# Patient Record
Sex: Male | Born: 1955 | Race: Asian | Hispanic: No | Marital: Married | Smoking: Former smoker
Health system: Southern US, Community
[De-identification: ages and names within clinical notes are randomized; demographics above are authoritative.]

## PROBLEM LIST (undated history)

## (undated) DIAGNOSIS — E785 Hyperlipidemia, unspecified: Secondary | ICD-10-CM

## (undated) DIAGNOSIS — E119 Type 2 diabetes mellitus without complications: Secondary | ICD-10-CM

## (undated) DIAGNOSIS — M1990 Unspecified osteoarthritis, unspecified site: Secondary | ICD-10-CM

## (undated) DIAGNOSIS — I10 Essential (primary) hypertension: Secondary | ICD-10-CM

## (undated) HISTORY — DX: Hyperlipidemia, unspecified: E78.5

---

## 2016-06-26 ENCOUNTER — Ambulatory Visit: Payer: Self-pay | Admitting: Family Medicine

## 2016-09-07 ENCOUNTER — Emergency Department
Admission: EM | Admit: 2016-09-07 | Discharge: 2016-09-08 | Disposition: A | Discharge status: Home / Self Care | Payer: Medicaid Other | Attending: Emergency Medicine | Admitting: Emergency Medicine

## 2016-09-07 ENCOUNTER — Emergency Department: Payer: Medicaid Other

## 2016-09-07 ENCOUNTER — Encounter: Payer: Self-pay | Admitting: Emergency Medicine

## 2016-09-07 DIAGNOSIS — Y929 Unspecified place or not applicable: Secondary | ICD-10-CM | POA: Diagnosis not present

## 2016-09-07 DIAGNOSIS — Y990 Civilian activity done for income or pay: Secondary | ICD-10-CM | POA: Insufficient documentation

## 2016-09-07 DIAGNOSIS — E119 Type 2 diabetes mellitus without complications: Secondary | ICD-10-CM | POA: Diagnosis not present

## 2016-09-07 DIAGNOSIS — Y9389 Activity, other specified: Secondary | ICD-10-CM | POA: Diagnosis not present

## 2016-09-07 DIAGNOSIS — S060X0A Concussion without loss of consciousness, initial encounter: Secondary | ICD-10-CM

## 2016-09-07 DIAGNOSIS — I10 Essential (primary) hypertension: Secondary | ICD-10-CM | POA: Diagnosis not present

## 2016-09-07 DIAGNOSIS — W2209XA Striking against other stationary object, initial encounter: Secondary | ICD-10-CM | POA: Insufficient documentation

## 2016-09-07 DIAGNOSIS — S0990XA Unspecified injury of head, initial encounter: Secondary | ICD-10-CM | POA: Diagnosis present

## 2016-09-07 HISTORY — DX: Unspecified osteoarthritis, unspecified site: M19.90

## 2016-09-07 HISTORY — DX: Type 2 diabetes mellitus without complications: E11.9

## 2016-09-07 HISTORY — DX: Essential (primary) hypertension: I10

## 2016-09-07 LAB — COMPREHENSIVE METABOLIC PANEL
ALK PHOS: 78 U/L (ref 38–126)
ALT: 18 U/L (ref 17–63)
ANION GAP: 6 (ref 5–15)
AST: 24 U/L (ref 15–41)
Albumin: 4.1 g/dL (ref 3.5–5.0)
BILIRUBIN TOTAL: 0.4 mg/dL (ref 0.3–1.2)
BUN: 17 mg/dL (ref 6–20)
CALCIUM: 8.8 mg/dL — AB (ref 8.9–10.3)
CO2: 24 mmol/L (ref 22–32)
Chloride: 105 mmol/L (ref 101–111)
Creatinine, Ser: 1.1 mg/dL (ref 0.61–1.24)
GFR calc non Af Amer: >60 mL/min (ref >60)
GLUCOSE: 207 mg/dL — AB (ref 65–99)
Potassium: 3.9 mmol/L (ref 3.5–5.1)
Sodium: 135 mmol/L (ref 135–145)
TOTAL PROTEIN: 7.5 g/dL (ref 6.5–8.1)

## 2016-09-07 LAB — CBC WITH DIFFERENTIAL/PLATELET
Basophils Absolute: 0.1 10*3/uL (ref 0–0.1)
Basophils Relative: 1 %
Eosinophils Absolute: 0.2 10*3/uL (ref 0–0.7)
Eosinophils Relative: 2 %
HEMATOCRIT: 38.7 % — AB (ref 40.0–52.0)
Hemoglobin: 12.8 g/dL — ABNORMAL LOW (ref 13.0–18.0)
LYMPHS ABS: 2.7 10*3/uL (ref 1.0–3.6)
Lymphocytes Relative: 28 %
MCH: 25.3 pg — AB (ref 26.0–34.0)
MCHC: 33.2 g/dL (ref 32.0–36.0)
MCV: 76.3 fL — AB (ref 80.0–100.0)
MONOS PCT: 9 %
Monocytes Absolute: 0.8 10*3/uL (ref 0.2–1.0)
NEUTROS ABS: 5.7 10*3/uL (ref 1.4–6.5)
NEUTROS PCT: 60 %
Platelets: 212 10*3/uL (ref 150–440)
RBC: 5.07 MIL/uL (ref 4.40–5.90)
RDW: 13.4 % (ref 11.5–14.5)
WBC: 9.5 10*3/uL (ref 3.8–10.6)

## 2016-09-07 LAB — SALICYLATE LEVEL: Salicylate Lvl: <7 mg/dL (ref 2.8–30.0)

## 2016-09-07 NOTE — ED Triage Notes (Signed)
States he hears a hum in his left ear that is constant for 3 days and also his left eye he has periodic blurry vision in his left eye that lasts any where from 5 to 15 mins then resolves.

## 2016-09-07 NOTE — ED Notes (Signed)
Pt reports 2 days of left earache, pressure behind left eye and blurred; vision; ambulatory with steady gait; declined offer of wheelchair

## 2016-09-07 NOTE — ED Provider Notes (Signed)
[facility] Emergency Department Provider Note  ____________________________________________   First MD Initiated Contact with Patient 09/07/16 2306     (approximate)  I have reviewed the triage vital signs and the nursing notes.   HISTORY  Chief Complaint Tinnitus and Blurred Vision   HPI Samuel Brady is a 60 y.o. male with a history of diabetes and hypertension presenting with 3 days of left-sided headache as well as intermittently blurred vision to his left eye and ringing in his left ear. He says that this all started after he hit his head against the door accidentally work. He says that he was dazed but did not lose consciousness. Says that he has a mild-to-moderate headache as well to left side of his head. Takes a daily aspirin but no other blood thinners. He says that he spoke to his primary care doctor about his current symptoms who suggested he come to the emergency department if the symptoms are persistent. He says that the blurred vision to the left eye is intermittent and that right now he has normal vision. He says that the ringing in his ear is new since this trauma as well. 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   Past Medical History:  Diagnosis Date  . Arthritis   . Diabetes mellitus without complication (HCC)   . Hypertension     There are no active problems to display for this patient.   History reviewed. No pertinent surgical history.  Prior to Admission medications   Not on File    Allergies Patient has no known allergies.  History reviewed. No pertinent family history.  Social History Social History  Substance Use Topics  . Smoking status: Never Smoker  . Smokeless tobacco: Never Used  . Alcohol use No    Review of Systems Constitutional: No fever/chills Eyes:  visual changes. ENT: No sore throat. Cardiovascular: Denies chest pain. Respiratory: Denies shortness of  breath. Gastrointestinal: No abdominal pain.  No nausea, no vomiting.  No diarrhea.  No constipation. Genitourinary: Negative for dysuria. Musculoskeletal: Negative for back pain. Skin: Negative for rash. Neurological: Negative for focal weakness or numbness.  10-point ROS otherwise negative.  ____________________________________________   PHYSICAL EXAM:  VITAL SIGNS: ED Triage Vitals  Enc Vitals Group     BP 09/07/16 2008 (!) 147/97     Pulse Rate 09/07/16 2008 87     Resp 09/07/16 2008 18     Temp 09/07/16 2008 98.5 F (36.9 C)     Temp src --      SpO2 09/07/16 2008 98 %     Weight 09/07/16 2009 165 lb (74.8 kg)     Height 09/07/16 2009 5\' 6"  (1.676 m)     Head Circumference --      Peak Flow --      Pain Score --      Pain Loc --      Pain Edu? --      Excl. in GC? --     Constitutional: Alert and oriented. Well appearing and in no acute distress. Eyes: Conjunctivae are normal. PERRL. EOMI. Head: Atraumatic.Normal TMs bilaterally. Nose: No congestion/rhinnorhea. Mouth/Throat: Mucous membranes are moist.   Neck: No stridor.   Cardiovascular: Normal rate, regular rhythm. Grossly normal heart sounds.   Respiratory: Normal respiratory effort.  No retractions. Lungs CTAB. Gastrointestinal: Soft and nontender. No distention.  Musculoskeletal: No lower extremity tenderness nor edema.  No joint effusions. Neurologic:  Normal speech and language. No gross focal neurologic deficits are appreciated.  Skin:  Skin is warm, dry and intact. No rash noted. Psychiatric: Mood and affect are normal. Speech and behavior are normal.  ____________________________________________   LABS (all labs ordered are listed, but only abnormal results are displayed)  Labs Reviewed  CBC WITH DIFFERENTIAL/PLATELET - Abnormal; Notable for the following:       Result Value   Hemoglobin 12.8 (*)    HCT 38.7 (*)    MCV 76.3 (*)    MCH 25.3 (*)    All other components within normal limits   COMPREHENSIVE METABOLIC PANEL - Abnormal; Notable for the following:    Glucose, Bld 207 (*)    Calcium 8.8 (*)    All other components within normal limits  SALICYLATE LEVEL   ____________________________________________  EKG   ____________________________________________  RADIOLOGY  CT Head Wo Contrast (Final result)  Result time 09/08/16 01:18:20  Final result by Cathie BeamsKevin O Herman, MD (09/08/16 01:18:20)           Narrative:   CLINICAL DATA: Headache with humming sound in left ear and intermittent blurry vision.  EXAM: CT HEAD WITHOUT CONTRAST  TECHNIQUE: Contiguous axial images were obtained from the base of the skull through the vertex without intravenous contrast.  COMPARISON: None.  FINDINGS: Brain: No mass lesion, intraparenchymal hemorrhage or extra-axial collection. No evidence of acute cortical infarct. Brain parenchyma and CSF-containing spaces are normal for age.  Vascular: No hyperdense vessel or unexpected calcification.  Skull: Normal visualized skull base, calvarium and extracranial soft tissues.  Sinuses/Orbits: No sinus fluid levels or advanced mucosal thickening. No mastoid effusion. Normal orbits.  IMPRESSION: Normal head CT.   Electronically Signed By: Deatra RobinsonKevin Herman M.D. On: 09/08/2016 01:18          ____________________________________________   PROCEDURES  Procedure(s) performed:   Procedures  Critical Care performed:   ____________________________________________   INITIAL IMPRESSION / ASSESSMENT AND PLAN / ED COURSE  Pertinent labs & imaging results that were available during my care of the patient were reviewed by me and considered in my medical decision making (see chart for details).  Suspect concussion causing the patient's symptoms.  Clinical Course    ----------------------------------------- 1:32 AM on 09/08/2016 -----------------------------------------  Patient resting comfortably at this  time. Updated him about the very reassuring head CT. Likely exhalation for the symptoms is concussion. We discussed brain rest and preventing any repeat injury. The patient is understanding willing to comply. Will be discharged home.  ____________________________________________   FINAL CLINICAL IMPRESSION(S) / ED DIAGNOSES  Concussion.    NEW MEDICATIONS STARTED DURING THIS VISIT:  New Prescriptions   No medications on file     Note:  This document was prepared using Dragon voice recognition software and may include unintentional dictation errors.    Myrna Blazeravid Matthew Schaevitz, MD 09/08/16 [phone]

## 2016-09-08 NOTE — ED Notes (Signed)
Pt found in room att 

## 2017-01-28 ENCOUNTER — Encounter: Payer: Self-pay | Admitting: Emergency Medicine

## 2017-01-28 ENCOUNTER — Emergency Department
Admission: EM | Admit: 2017-01-28 | Discharge: 2017-01-28 | Disposition: A | Discharge status: Home / Self Care | Payer: Medicaid Other | Attending: Emergency Medicine | Admitting: Emergency Medicine

## 2017-01-28 DIAGNOSIS — R197 Diarrhea, unspecified: Secondary | ICD-10-CM

## 2017-01-28 DIAGNOSIS — E119 Type 2 diabetes mellitus without complications: Secondary | ICD-10-CM | POA: Insufficient documentation

## 2017-01-28 DIAGNOSIS — I10 Essential (primary) hypertension: Secondary | ICD-10-CM | POA: Diagnosis not present

## 2017-01-28 LAB — CBC
HCT: 44.4 % (ref 40.0–52.0)
Hemoglobin: 15 g/dL (ref 13.0–18.0)
MCH: 24.8 pg — AB (ref 26.0–34.0)
MCHC: 33.7 g/dL (ref 32.0–36.0)
MCV: 73.6 fL — AB (ref 80.0–100.0)
PLATELETS: 209 10*3/uL (ref 150–440)
RBC: 6.03 MIL/uL — ABNORMAL HIGH (ref 4.40–5.90)
RDW: 14 % (ref 11.5–14.5)
WBC: 10.8 10*3/uL — ABNORMAL HIGH (ref 3.8–10.6)

## 2017-01-28 LAB — COMPREHENSIVE METABOLIC PANEL
ALBUMIN: 4.5 g/dL (ref 3.5–5.0)
ALT: 23 U/L (ref 17–63)
AST: 27 U/L (ref 15–41)
Alkaline Phosphatase: 64 U/L (ref 38–126)
Anion gap: 8 (ref 5–15)
BUN: 16 mg/dL (ref 6–20)
CHLORIDE: 104 mmol/L (ref 101–111)
CO2: 23 mmol/L (ref 22–32)
CREATININE: 1.01 mg/dL (ref 0.61–1.24)
Calcium: 8.9 mg/dL (ref 8.9–10.3)
GFR calc Af Amer: >60 mL/min (ref >60)
GFR calc non Af Amer: >60 mL/min (ref >60)
GLUCOSE: 137 mg/dL — AB (ref 65–99)
Potassium: 3.7 mmol/L (ref 3.5–5.1)
Sodium: 135 mmol/L (ref 135–145)
Total Bilirubin: 1.2 mg/dL (ref 0.3–1.2)
Total Protein: 7.8 g/dL (ref 6.5–8.1)

## 2017-01-28 LAB — URINALYSIS, COMPLETE (UACMP) WITH MICROSCOPIC
BACTERIA UA: NONE SEEN
Bilirubin Urine: NEGATIVE
Glucose, UA: NEGATIVE mg/dL
Hgb urine dipstick: NEGATIVE
KETONES UR: 20 mg/dL — AB
Leukocytes, UA: NEGATIVE
Nitrite: NEGATIVE
PH: 5 (ref 5.0–8.0)
Protein, ur: NEGATIVE mg/dL
RBC / HPF: NONE SEEN RBC/hpf (ref 0–5)
SPECIFIC GRAVITY, URINE: 1.026 (ref 1.005–1.030)
SQUAMOUS EPITHELIAL / LPF: NONE SEEN
WBC, UA: NONE SEEN WBC/hpf (ref 0–5)

## 2017-01-28 LAB — GASTROINTESTINAL PANEL BY PCR, STOOL (REPLACES STOOL CULTURE)

## 2017-01-28 LAB — LIPASE, BLOOD: LIPASE: 25 U/L (ref 11–51)

## 2017-01-28 MED ORDER — LOPERAMIDE HCL 2 MG PO CAPS
4.0000 mg | ORAL_CAPSULE | Freq: Once | ORAL | Status: AC
  Administered 2017-01-28: 4 mg via ORAL
  Filled 2017-01-28: qty 2

## 2017-01-28 MED ORDER — ACETAMINOPHEN 500 MG PO TABS
1000.0000 mg | ORAL_TABLET | Freq: Once | ORAL | Status: AC
  Administered 2017-01-28: 1000 mg via ORAL
  Filled 2017-01-28: qty 2

## 2017-01-28 MED ORDER — ONDANSETRON 4 MG PO TBDP: 4.0000 mg | ORAL_TABLET | Freq: Three times a day (TID) | ORAL | 0 refills | Status: DC | PRN

## 2017-01-28 MED ORDER — LOPERAMIDE HCL 2 MG PO TABS: 2.0000 mg | ORAL_TABLET | Freq: Four times a day (QID) | ORAL | 0 refills | Status: AC | PRN

## 2017-01-28 MED ORDER — SODIUM CHLORIDE 0.9 % IV BOLUS (SEPSIS)
1000.0000 mL | Freq: Once | INTRAVENOUS | Status: AC
  Administered 2017-01-28: 1000 mL via INTRAVENOUS

## 2017-01-28 NOTE — ED Notes (Signed)
Patient reports continuing to have episodes of diarrhea.  Patient reports feeling extremely tired.

## 2017-01-28 NOTE — ED Notes (Signed)
Patient to bedside commode with diarrhea at this time.

## 2017-01-28 NOTE — ED Notes (Signed)
Pt taken to car via wheelchair., Pt verbalized feeling safe to go home. Pt reports he continues to feel weak and RN explained to take time to rest and take medications to manage pain.

## 2017-01-28 NOTE — ED Notes (Signed)
Patient sleeping at this time.  No obvious distress.

## 2017-01-28 NOTE — ED Provider Notes (Signed)
[facility] Emergency Department Provider Note    First MD Initiated Contact with Patient 01/28/17 [phone]     (approximate)  I have reviewed the triage vital signs and the nursing notes.   HISTORY  Chief Complaint Diarrhea    HPI Race Samuel Brady is a 61 y.o. male with below list of chronic medical conditions presents to the emergency department with a one-day history of nausea vomiting and nonbloody diarrhea. Patient denies any abdominal pain or fever. Of note patient's children are expressing the same symptoms.   Past Medical History:  Diagnosis Date  . Arthritis   . Diabetes mellitus without complication (HCC)   . Hypertension     There are no active problems to display for this patient.   Past surgical history None  Prior to Admission medications   Medication Sig Start Date End Date Taking? Authorizing Provider  loperamide (LOPERAMIDE A-D) 2 MG tablet Take 1 tablet (2 mg total) by mouth 4 (four) times daily as needed for diarrhea or loose stools. 01/28/17   Darci Current, MD  ondansetron (ZOFRAN ODT) 4 MG disintegrating tablet Take 1 tablet (4 mg total) by mouth every 8 (eight) hours as needed for nausea or vomiting. 01/28/17   Darci Current, MD    Allergies Patient has no known allergies.  No family history on file.  Social History Social History  Substance Use Topics  . Smoking status: Never Smoker  . Smokeless tobacco: Never Used  . Alcohol use No    Review of Systems Constitutional: No fever/chills Eyes: No visual changes. ENT: No sore throat. Cardiovascular: Denies chest pain. Respiratory: Denies shortness of breath. Gastrointestinal: No abdominal pain. Positive for nausea vomiting and diarrhea. Genitourinary: Negative for dysuria. Musculoskeletal: Negative for back pain. Integumentary: Negative for rash. Neurological: Negative for headaches, focal weakness or  numbness.   ____________________________________________   PHYSICAL EXAM:  VITAL SIGNS: ED Triage Vitals  Enc Vitals Group     BP 01/28/17 0314 130/82     Pulse Rate 01/28/17 0314 (!) 102     Resp 01/28/17 0314 20     Temp 01/28/17 0314 98.7 F (37.1 C)     Temp Source 01/28/17 0314 Oral     SpO2 01/28/17 0314 97 %     Weight 01/28/17 0314 162 lb (73.5 kg)     Height 01/28/17 0314 5\' 6"  (1.676 m)     Head Circumference --      Peak Flow --      Pain Score 01/28/17 0544 7     Pain Loc --      Pain Edu? --      Excl. in GC? --     Constitutional: Alert and oriented. Well appearing and in no acute distress. Eyes: Conjunctivae are normal. PERRL. EOMI. Head: Atraumatic. Mouth/Throat: Mucous membranes are moist. Oropharynx non-erythematous. Neck: No stridor.  Cardiovascular: Normal rate, regular rhythm. Good peripheral circulation. Grossly normal heart sounds. Respiratory: Normal respiratory effort.  No retractions. Lungs CTAB. Gastrointestinal: Soft and nontender. No distention.  Musculoskeletal: No lower extremity tenderness nor edema. No gross deformities of extremities. Neurologic:  Normal speech and language. No gross focal neurologic deficits are appreciated.  Skin:  Skin is warm, dry and intact. No rash noted. Psychiatric: Mood and affect are normal. Speech and behavior are normal.  ____________________________________________   LABS (all labs ordered are listed, but only abnormal results are displayed)  Labs Reviewed  COMPREHENSIVE METABOLIC PANEL - Abnormal; Notable for the following:  Result Value   Glucose, Bld 137 (*)    All other components within normal limits  CBC - Abnormal; Notable for the following:    WBC 10.8 (*)    RBC 6.03 (*)    MCV 73.6 (*)    MCH 24.8 (*)    All other components within normal limits  URINALYSIS, COMPLETE (UACMP) WITH MICROSCOPIC - Abnormal; Notable for the following:    Color, Urine YELLOW (*)    APPearance CLEAR (*)     Ketones, ur 20 (*)    All other components within normal limits  GASTROINTESTINAL PANEL BY PCR, STOOL (REPLACES STOOL CULTURE)  LIPASE, BLOOD     Procedures   ____________________________________________   INITIAL IMPRESSION / ASSESSMENT AND PLAN / ED COURSE  Pertinent labs & imaging results that were available during my care of the patient were reviewed by me and considered in my medical decision making (see chart for details).  61 year old male presenting with nausea vomiting and diarrhea times one day. Patient's children currently have the same symptoms and a such suspect gastroenteritis as etiology for the patient's symptoms. Patient has no abdominal pain on exam and a such imaging was not performed. Patient received 2 L IV normal saline and Imodium 4 mg and Zofran 4 mg.      ____________________________________________  FINAL CLINICAL IMPRESSION(S) / ED DIAGNOSES  Final diagnoses:  Diarrhea, unspecified type     MEDICATIONS GIVEN DURING THIS VISIT:  Medications  sodium chloride 0.9 % bolus 1,000 mL (1,000 mLs Intravenous New Bag/Given 01/28/17 0732)  loperamide (IMODIUM) capsule 4 mg (4 mg Oral Given 01/28/17 0616)  sodium chloride 0.9 % bolus 1,000 mL (0 mLs Intravenous Stopped 01/28/17 0720)     NEW OUTPATIENT MEDICATIONS STARTED DURING THIS VISIT:  New Prescriptions   LOPERAMIDE (LOPERAMIDE A-D) 2 MG TABLET    Take 1 tablet (2 mg total) by mouth 4 (four) times daily as needed for diarrhea or loose stools.   ONDANSETRON (ZOFRAN ODT) 4 MG DISINTEGRATING TABLET    Take 1 tablet (4 mg total) by mouth every 8 (eight) hours as needed for nausea or vomiting.    Modified Medications   No medications on file    Discontinued Medications   No medications on file     Note:  This document was prepared using Dragon voice recognition software and may include unintentional dictation errors.    Darci CurrentBrown, [place] N, MD 01/28/17 [phone]

## 2017-01-28 NOTE — ED Provider Notes (Signed)
-----------------------------------------   9:01 AM on 01/28/2017 -----------------------------------------   Blood pressure (!) 150/96, pulse (!) 108, temperature (!) 101 F (38.3 C), temperature source Oral, resp. rate 18, height 5\' 6"  (1.676 m), weight 162 lb (73.5 kg), SpO2 96 %.  Assuming care from Dr. Manson PasseyBrown of Paul[facility] Goodan Angelo is a 61 y.o. male with a chief complaint of Diarrhea .    8M who presents with his 3418 month old child both with N/V/D. Pending biofire which is back positive for rotavirus. Patient received 2L of NS. Now spiked a fever and received tylenol. Tolerating PO. Labs with no acute findings. Will dc with zofran and supportive care. Discuss signs and symptoms of dehydration and recommended return to the ER if those develop.   Nita SickleVeronese, [place], MD 01/28/17 [phone]

## 2017-01-28 NOTE — ED Triage Notes (Signed)
Pt to triage via w/c with no distress noted; pt reports N/V/D since yesterday; denies abd pain

## 2017-02-04 ENCOUNTER — Other Ambulatory Visit: Payer: Self-pay | Admitting: Rheumatology

## 2017-02-04 DIAGNOSIS — R413 Other amnesia: Secondary | ICD-10-CM

## 2017-02-14 ENCOUNTER — Ambulatory Visit
Admission: RE | Admit: 2017-02-14 | Discharge: 2017-02-14 | Disposition: A | Discharge status: Home / Self Care | Payer: Medicaid Other | Source: Ambulatory Visit | Attending: Rheumatology | Admitting: Rheumatology

## 2017-06-01 ENCOUNTER — Ambulatory Visit
Admission: RE | Admit: 2017-06-01 | Discharge: 2017-06-01 | Disposition: A | Discharge status: Home / Self Care | Payer: Disability Insurance | Source: Ambulatory Visit | Attending: Ophthalmology | Admitting: Ophthalmology

## 2017-06-01 ENCOUNTER — Other Ambulatory Visit: Payer: Self-pay | Admitting: Ophthalmology

## 2017-06-01 DIAGNOSIS — R52 Pain, unspecified: Secondary | ICD-10-CM

## 2017-06-01 DIAGNOSIS — M170 Bilateral primary osteoarthritis of knee: Secondary | ICD-10-CM | POA: Diagnosis not present

## 2017-06-01 DIAGNOSIS — M25561 Pain in right knee: Secondary | ICD-10-CM | POA: Diagnosis present

## 2017-06-01 DIAGNOSIS — M25562 Pain in left knee: Secondary | ICD-10-CM | POA: Diagnosis present

## 2017-11-02 ENCOUNTER — Emergency Department
Admission: EM | Admit: 2017-11-02 | Discharge: 2017-11-02 | Disposition: A | Discharge status: Home / Self Care | Payer: Medicaid Other | Attending: Emergency Medicine | Admitting: Emergency Medicine

## 2017-11-02 ENCOUNTER — Other Ambulatory Visit: Payer: Self-pay

## 2017-11-02 ENCOUNTER — Encounter: Payer: Self-pay | Admitting: Emergency Medicine

## 2017-11-02 DIAGNOSIS — R1013 Epigastric pain: Secondary | ICD-10-CM | POA: Insufficient documentation

## 2017-11-02 DIAGNOSIS — R197 Diarrhea, unspecified: Secondary | ICD-10-CM | POA: Diagnosis not present

## 2017-11-02 DIAGNOSIS — I10 Essential (primary) hypertension: Secondary | ICD-10-CM | POA: Diagnosis not present

## 2017-11-02 DIAGNOSIS — R112 Nausea with vomiting, unspecified: Secondary | ICD-10-CM | POA: Insufficient documentation

## 2017-11-02 DIAGNOSIS — E119 Type 2 diabetes mellitus without complications: Secondary | ICD-10-CM | POA: Diagnosis not present

## 2017-11-02 DIAGNOSIS — R079 Chest pain, unspecified: Secondary | ICD-10-CM | POA: Diagnosis not present

## 2017-11-02 LAB — COMPREHENSIVE METABOLIC PANEL
ALBUMIN: 4.5 g/dL (ref 3.5–5.0)
ALT: 23 U/L (ref 17–63)
AST: 27 U/L (ref 15–41)
Alkaline Phosphatase: 59 U/L (ref 38–126)
Anion gap: 12 (ref 5–15)
BUN: 25 mg/dL — AB (ref 6–20)
CHLORIDE: 102 mmol/L (ref 101–111)
CO2: 23 mmol/L (ref 22–32)
CREATININE: 1.3 mg/dL — AB (ref 0.61–1.24)
Calcium: 9 mg/dL (ref 8.9–10.3)
GFR calc Af Amer: >60 mL/min (ref >60)
GFR calc non Af Amer: 59 mL/min — ABNORMAL LOW (ref >60)
GLUCOSE: 156 mg/dL — AB (ref 65–99)
Potassium: 3.9 mmol/L (ref 3.5–5.1)
SODIUM: 137 mmol/L (ref 135–145)
Total Bilirubin: 1.6 mg/dL — ABNORMAL HIGH (ref 0.3–1.2)
Total Protein: 7.6 g/dL (ref 6.5–8.1)

## 2017-11-02 LAB — CBC WITH DIFFERENTIAL/PLATELET
BASOS ABS: 0 10*3/uL (ref 0–0.1)
Basophils Relative: 0 %
EOS ABS: 0.2 10*3/uL (ref 0–0.7)
EOS PCT: 1 %
HCT: 42.7 % (ref 40.0–52.0)
HEMOGLOBIN: 14 g/dL (ref 13.0–18.0)
Lymphocytes Relative: 3 %
Lymphs Abs: 0.4 10*3/uL — ABNORMAL LOW (ref 1.0–3.6)
MCH: 24.7 pg — ABNORMAL LOW (ref 26.0–34.0)
MCHC: 32.7 g/dL (ref 32.0–36.0)
MCV: 75.5 fL — ABNORMAL LOW (ref 80.0–100.0)
Monocytes Absolute: 0.6 10*3/uL (ref 0.2–1.0)
Monocytes Relative: 4 %
NEUTROS PCT: 92 %
Neutro Abs: 12.1 10*3/uL — ABNORMAL HIGH (ref 1.4–6.5)
PLATELETS: 192 10*3/uL (ref 150–440)
RBC: 5.66 MIL/uL (ref 4.40–5.90)
RDW: 14.1 % (ref 11.5–14.5)
WBC: 13.3 10*3/uL — AB (ref 3.8–10.6)

## 2017-11-02 LAB — LIPASE, BLOOD: Lipase: 31 U/L (ref 11–51)

## 2017-11-02 LAB — GLUCOSE, CAPILLARY: GLUCOSE-CAPILLARY: 159 mg/dL — AB (ref 65–99)

## 2017-11-02 LAB — TROPONIN I
Troponin I: <0.03 ng/mL (ref <0.03)
Troponin I: <0.03 ng/mL (ref <0.03)

## 2017-11-02 MED ORDER — ONDANSETRON HCL 4 MG/2ML IJ SOLN
INTRAMUSCULAR | Status: AC
  Filled 2017-11-02: qty 2

## 2017-11-02 MED ORDER — ONDANSETRON 4 MG PO TBDP: 4.0000 mg | ORAL_TABLET | Freq: Three times a day (TID) | ORAL | 0 refills | Status: DC | PRN

## 2017-11-02 MED ORDER — SODIUM CHLORIDE 0.9 % IV BOLUS (SEPSIS)
1000.0000 mL | Freq: Once | INTRAVENOUS | Status: AC
  Administered 2017-11-02: 1000 mL via INTRAVENOUS

## 2017-11-02 MED ORDER — ONDANSETRON HCL 4 MG/2ML IJ SOLN
4.0000 mg | Freq: Once | INTRAMUSCULAR | Status: DC
Start: 2017-11-02 — End: 2017-11-02

## 2017-11-02 MED ORDER — ONDANSETRON HCL 4 MG/2ML IJ SOLN
4.0000 mg | Freq: Once | INTRAMUSCULAR | Status: AC
  Administered 2017-11-02: 4 mg via INTRAVENOUS

## 2017-11-02 NOTE — ED Notes (Signed)
Pt updated on treatment plan. Pt verbalizes understanding. Pt states chest pain is improving.

## 2017-11-02 NOTE — ED Triage Notes (Signed)
Pt with vomiting and diarrhea for four hours. Pt with chest pain that began after vomiting.

## 2017-11-02 NOTE — ED Notes (Signed)
Report to jerri, rn

## 2017-11-02 NOTE — ED Notes (Signed)
Pt states last emesis around 0300 and last diarrhea immediately prior to ems arrival. Pt states he has pain to epigastric area, left and right chest and bilateral upper arms. Pt with normal color, warm and dry skin. Pt states he became short of breath after onset of vomiting and began to have chest pain after onset of vomiting.

## 2017-11-02 NOTE — ED Provider Notes (Signed)
[facility] Emergency Department Provider Note    First MD Initiated Contact with Patient 11/02/17 [phone]     (approximate)  I have reviewed the triage vital signs and the nursing notes.   HISTORY  Chief Complaint Emesis and Diarrhea    HPI Samuel Brady is a 62 y.o. male with below list of chronic medical conditions including hypertension and diabetes presents to the emergency department via EMS with acute onset of nonbloody emesis and diarrhea which began 4 hours ago.  Patient states chest discomfort began after vomiting.  Patient admits to current 7 out of 10 epigastric abdominal pain.  Patient denies any fever.  Patient denies any dyspnea or cough.   Past Medical History:  Diagnosis Date  . Arthritis   . Diabetes mellitus without complication (HCC)   . Hypertension     There are no active problems to display for this patient.   Past surgical history None  Prior to Admission medications   Medication Sig Start Date End Date Taking? Authorizing Provider  loperamide (LOPERAMIDE A-D) 2 MG tablet Take 1 tablet (2 mg total) by mouth 4 (four) times daily as needed for diarrhea or loose stools. 01/28/17   Darci Current, MD  ondansetron (ZOFRAN ODT) 4 MG disintegrating tablet Take 1 tablet (4 mg total) by mouth every 8 (eight) hours as needed for nausea or vomiting. 01/28/17   Darci Current, MD    Allergies No known drug allergies History reviewed. No pertinent family history.  Social History Social History   Tobacco Use  . Smoking status: Never Smoker  . Smokeless tobacco: Never Used  Substance Use Topics  . Alcohol use: No  . Drug use: No    Review of Systems Constitutional: No fever/chills Eyes: No visual changes. ENT: No sore throat. Cardiovascular: Denies chest pain. Respiratory: Denies shortness of breath. Gastrointestinal: Positive for abdominal pain nausea vomiting and diarrhea  genitourinary: Negative for  dysuria. Musculoskeletal: Negative for neck pain.  Negative for back pain. Integumentary: Negative for rash. Neurological: Negative for headaches, focal weakness or numbness.  ____________________________________________   PHYSICAL EXAM:  VITAL SIGNS: ED Triage Vitals  Enc Vitals Group     BP 11/02/17 0443 (!) 145/114     Pulse Rate 11/02/17 0443 92     Resp 11/02/17 0443 16     Temp 11/02/17 0446 99 F (37.2 C)     Temp Source 11/02/17 0443 Oral     SpO2 11/02/17 0443 97 %     Weight 11/02/17 0444 72.6 kg (160 lb)     Height 11/02/17 0444 1.676 m (5\' 6" )     Head Circumference --      Peak Flow --      Pain Score 11/02/17 0444 4     Pain Loc --      Pain Edu? --      Excl. in GC? --     Constitutional: Alert and oriented. Well appearing and in no acute distress. Eyes: Conjunctivae are normal.  Head: Atraumatic. Mouth/Throat: Mucous membranes are moist. Oropharynx non-erythematous. Neck: No stridor.   Cardiovascular: Normal rate, regular rhythm. Good peripheral circulation. Grossly normal heart sounds. Respiratory: Normal respiratory effort.  No retractions. Lungs CTAB. Gastrointestinal: Epigastric tenderness to palpation.  No distention.  Musculoskeletal: No lower extremity tenderness nor edema. No gross deformities of extremities. Neurologic:  Normal speech and language. No gross focal neurologic deficits are appreciated.  Skin:  Skin is warm, dry and intact. No rash noted.  Psychiatric: Mood and affect are normal. Speech and behavior are normal.  ____________________________________________   LABS (all labs ordered are listed, but only abnormal results are displayed)  Labs Reviewed  GLUCOSE, CAPILLARY - Abnormal; Notable for the following components:      Result Value   Glucose-Capillary 159 (*)    All other components within normal limits  CBC WITH DIFFERENTIAL/PLATELET - Abnormal; Notable for the following components:   WBC 13.3 (*)    MCV 75.5 (*)    MCH  24.7 (*)    Neutro Abs 12.1 (*)    Lymphs Abs 0.4 (*)    All other components within normal limits  COMPREHENSIVE METABOLIC PANEL - Abnormal; Notable for the following components:   Glucose, Bld 156 (*)    BUN 25 (*)    Creatinine, Ser 1.30 (*)    Total Bilirubin 1.6 (*)    GFR calc non Af Amer 59 (*)    All other components within normal limits  TROPONIN I  TROPONIN I   ____________________________________________  EKG  ED ECG REPORT I, [place] N Monie Shere, the attending physician, personally viewed and interpreted this ECG.   Date: 11/02/2017  EKG Time: 4:46 AM  Rate: 97  Rhythm: Normal sinus rhythm  Axis: Normal  Intervals: Normal  ST&T Change: None  ED ECG REPORT I, [place] N Josie Burleigh, the attending physician, personally viewed and interpreted this ECG.   Date: 11/02/2017  EKG Time: 4:50 AM  Rate: 100  Rhythm: Sinus tachycardia  Axis: Normal  Intervals: Normal  ST&T Change: PR depression inferior leads   Procedures   ____________________________________________   INITIAL IMPRESSION / ASSESSMENT AND PLAN / ED COURSE  As part of my medical decision making, I reviewed the following data within the electronic MEDICAL RECORD [id number] year old male presenting to the emergency department history of acute onset of nausea vomiting and diarrhea.  Patient admits to chest pain following vomiting.  No further vomiting or diarrhea while in the emergency department patient was given Zofran and 1 L IV normal saline after arrival.  Suspect infectious etiology for the patient's abdominal discomfort with vomiting and diarrhea.  However did consider the possibility of CAD/MI given chest pain following vomiting as such troponin was obtained which was negative plan to repeat troponin and if negative patient will be discharged home with antiemetic.  Patient's care transferred to Dr. Cyril LoosenKinner. ____________________________________________  FINAL CLINICAL IMPRESSION(S) / ED DIAGNOSES  Final  diagnoses:  Nausea vomiting and diarrhea     MEDICATIONS GIVEN DURING THIS VISIT:  Medications  ondansetron (ZOFRAN) injection 4 mg (4 mg Intravenous Given 11/02/17 0456)  sodium chloride 0.9 % bolus 1,000 mL (0 mLs Intravenous Stopped 11/02/17 0644)     ED Discharge Orders    None       Note:  This document was prepared using Dragon voice recognition software and may include unintentional dictation errors.    Darci CurrentBrown, [place] N, MD 11/02/17 [phone]

## 2017-11-02 NOTE — ED Provider Notes (Signed)
Asked by my partner to follow up on 2nd troponin and if normal, to discharge patient.    Jene EveryKinner, Eliyas Suddreth, MD 11/02/17 [phone]

## 2019-04-01 ENCOUNTER — Other Ambulatory Visit: Payer: Self-pay | Admitting: Physician Assistant

## 2019-04-01 DIAGNOSIS — M25511 Pain in right shoulder: Secondary | ICD-10-CM

## 2019-04-04 ENCOUNTER — Ambulatory Visit
Admission: RE | Admit: 2019-04-04 | Discharge: 2019-04-04 | Disposition: A | Discharge status: Home / Self Care | Payer: Medicaid Other | Source: Ambulatory Visit | Attending: Physician Assistant | Admitting: Physician Assistant

## 2019-04-04 DIAGNOSIS — M25511 Pain in right shoulder: Secondary | ICD-10-CM

## 2019-04-14 NOTE — Progress Notes (Signed)
New Outpatient Visit Date: 04/15/2019  Referring Provider: Herminio Commons, [place],  [place] [address]  Chief Complaint: Right arm pain  HPI:  Mr. Hoar is a 63 y.o. male who is being seen today for the evaluation of right arm and shoulder pain. He has a history of hypertension, hyperlipidemia, and diabetes mellitus.  He reports 2 months of pain in the right upper arm.  Does not recall any specific trauma.  Pain is most pronounced when he tries to abduct at the shoulder.  He also feels like the muscles in his right upper arm have atrophied.  Patient denies history of prior cardiac disease or testing.  He has experienced occasional palpitations, particularly when he exerts himself.  He notices this a couple times a month.  He feels like his heart races and beats harder than usual with associated lightheadedness.  He also has experienced occasional vague episodes of chest discomfort, stating that it feels like there is something inside of him.  He cannot describe it further.  The symptoms have been present for about a year, and he is unsure if frequency or intensity are worsening.  Denies shortness of breath, orthopnea, PND, and edema.  --------------------------------------------------------------------------------------------------  Cardiovascular History & Procedures: Cardiovascular Problems:  Palpitations  Atypical chest pain  Risk Factors:  Hypertension, hyperlipidemia, diabetes mellitus, male gender, prior tobacco use, and age greater than 33.  Cath/PCI:  None  CV Surgery:  None  EP Procedures and Devices:  None  Non-Invasive Evaluation(s):  None  Recent CV Pertinent Labs: Lab Results  Component Value Date   K 3.9 11/02/2017   BUN 25 (H) 11/02/2017   CREATININE 1.30 (H) 11/02/2017    --------------------------------------------------------------------------------------------------  Past Medical History:  Diagnosis Date  .  Arthritis   . Diabetes mellitus without complication ([place])   . Hyperlipidemia   . Hypertension     History reviewed. No pertinent surgical history.  Current Meds  Medication Sig  . acetaminophen (TYLENOL) 500 MG tablet Take 500 mg by mouth every 6 (six) hours as needed.  Marland Kitchen atorvastatin (LIPITOR) 40 MG tablet Take 40 mg by mouth at bedtime.  . benzonatate (TESSALON) 100 MG capsule Take 100 mg by mouth 3 (three) times daily as needed for cough.  . clotrimazole (LOTRIMIN) 1 % cream Apply 1 application topically daily.  Marland Kitchen HYDROcodone-acetaminophen (NORCO/VICODIN) 5-325 MG tablet Take 1 tablet by mouth every 4 (four) hours as needed (breakthrough pain).  . hydrocortisone 2.5 % ointment Apply 1 application topically daily.  Marland Kitchen ibuprofen (ADVIL,MOTRIN) 600 MG tablet Take 600 mg by mouth every 6 (six) hours as needed.  Marland Kitchen lisinopril (PRINIVIL,ZESTRIL) 20 MG tablet Take 20 mg by mouth daily.  Marland Kitchen loperamide (LOPERAMIDE A-D) 2 MG tablet Take 1 tablet (2 mg total) by mouth 4 (four) times daily as needed for diarrhea or loose stools.  . metFORMIN (GLUCOPHAGE) 500 MG tablet Take 500 mg by mouth 2 (two) times daily with a meal.  . ondansetron (ZOFRAN ODT) 4 MG disintegrating tablet Take 1 tablet (4 mg total) by mouth every 8 (eight) hours as needed for nausea or vomiting.  . sertraline (ZOLOFT) 50 MG tablet Take 25 mg by mouth daily.    Allergies: Patient has no known allergies.  Social History   Tobacco Use  . Smoking status: Former Research scientist (life sciences)  . Smokeless tobacco: Never Used  . Tobacco comment: Pt quit 20+yrs ago  Substance Use Topics  . Alcohol use: No  . Drug use:  No    Family History  Problem Relation Age of Onset  . Heart disease Mother        Patient was [age] year old; died while pregnant.  Patient told it may have been a heart attack.  Marland Kitchen. Heart Problems Sister   . Heart attack Brother 65  . Brain cancer Sister   . Diabetes Brother     Review of Systems: A 12-system review of systems  was performed and was negative except as noted in the HPI.  --------------------------------------------------------------------------------------------------  Physical Exam: BP (!) 150/80 (BP Location: Left Arm, Patient Position: Sitting, Cuff Size: Normal)   Pulse (!) 57   Ht 5\' 6"  (1.676 m)   Wt 168 lb 4 oz (76.3 kg)   BMI 27.16 kg/m   General: NAD. HEENT: No conjunctival pallor or scleral icterus. Moist mucous membranes. OP clear. Neck: Supple without lymphadenopathy, thyromegaly, JVD, or HJR. No carotid bruit. Lungs: Normal work of breathing. Clear to auscultation bilaterally without wheezes or crackles. Heart: Bradycardic but regular without murmurs, rubs, or gallops. Non-displaced PMI. Abd: Bowel sounds present. Soft, NT/ND without hepatosplenomegaly Ext: No lower extremity edema. Radial, PT, and DP pulses are 2+ bilaterally.  4/5 strength in the right upper arm with abduction secondary to pain. Skin: Warm and dry without rash. Neuro: CNIII-XII intact. Fine-touch sensation intact in upper and lower extremities bilaterally. Psych: Normal mood and affect.  EKG: Normal sinus rhythm with early repolarization.  Heart rate has decreased from prior tracing on 11/02/2017.  Otherwise, no significant change.  Lab Results  Component Value Date   WBC 13.3 (H) 11/02/2017   HGB 14.0 11/02/2017   HCT 42.7 11/02/2017   MCV 75.5 (L) 11/02/2017   PLT 192 11/02/2017    Lab Results  Component Value Date   NA 137 11/02/2017   K 3.9 11/02/2017   CL 102 11/02/2017   CO2 23 11/02/2017   BUN 25 (H) 11/02/2017   CREATININE 1.30 (H) 11/02/2017   GLUCOSE 156 (H) 11/02/2017   ALT 23 11/02/2017    No results found for: CHOL, HDL, LDLCALC, LDLDIRECT, TRIG, CHOLHDL   --------------------------------------------------------------------------------------------------  ASSESSMENT AND PLAN: Palpitations and lightheadedness: Present for at least a year, usually brief and with activity.  There is  some associated lightheadedness.  We have agreed to obtain a 14-day event monitor for further characterization.  We will also request most recent labs from Dr. Sallee LangeSoles.  Atypical chest pain: Mr. Bufford Buttnerhou reports occasional vague discomfort in the chest, though it is not actually painful.  He has multiple cardiac risk factors.  EKG shows ST changes most consistent with early repolarization and a less pronounced than on prior EKG from 11/02/2017.  We will begin with a transthoracic echocardiogram and then discuss need for noninvasive ischemia testing after that.  I do not believe that his right shoulder pain is an anginal equivalent.  Hypertension: Blood pressure mildly elevated today, possibly due to pain.  I will defer medication changes at this time, though if blood pressure is consistently above 130/80, escalation of his antihypertensive therapy should be pursued by his PCP.  Right arm/shoulder pain: Most likely musculoskeletal.  I have a low suspicion for this being an anginal equivalent.  I recommend that the patient speak with his PCP, including about the possibility of an orthopedics evaluation.  Follow-up: Return to clinic in 6 weeks.  Yvonne Kendallhristopher Senta Kantor, MD 04/15/2019 9:57 AM

## 2019-04-15 ENCOUNTER — Other Ambulatory Visit: Payer: Self-pay

## 2019-04-15 ENCOUNTER — Ambulatory Visit (INDEPENDENT_AMBULATORY_CARE_PROVIDER_SITE_OTHER): Payer: Medicaid Other | Admitting: Internal Medicine

## 2019-04-15 ENCOUNTER — Encounter: Payer: Self-pay | Admitting: Internal Medicine

## 2019-04-15 ENCOUNTER — Ambulatory Visit: Payer: Disability Insurance

## 2019-04-15 VITALS — BP 150/80 | HR 57 | Ht 66.0 in | Wt 168.2 lb

## 2019-04-15 DIAGNOSIS — R42 Dizziness and giddiness: Secondary | ICD-10-CM | POA: Diagnosis not present

## 2019-04-15 DIAGNOSIS — R0789 Other chest pain: Secondary | ICD-10-CM | POA: Insufficient documentation

## 2019-04-15 DIAGNOSIS — M79601 Pain in right arm: Secondary | ICD-10-CM

## 2019-04-15 DIAGNOSIS — I10 Essential (primary) hypertension: Secondary | ICD-10-CM

## 2019-04-15 DIAGNOSIS — R002 Palpitations: Secondary | ICD-10-CM

## 2019-04-15 NOTE — Patient Instructions (Signed)
Medication Instructions:  Your physician recommends that you continue on your current medications as directed. Please refer to the Current Medication list given to you today.  If you need a refill on your cardiac medications before your next appointment, please call your pharmacy.   Lab work: - None ordered.  If you have labs (blood work) drawn today and your tests are completely normal, you will receive your results only by: Marland Kitchen. MyChart Message (if you have MyChart) OR . A paper copy in the mail If you have any lab test that is abnormal or we need to change your treatment, we will call you to review the results.  Testing/Procedures: Your physician has requested that you have an echocardiogram. Echocardiography is a painless test that uses sound waves to create images of your heart. It provides your doctor with information about the size and shape of your heart and how well your heart's chambers and valves are working. This procedure takes approximately one hour. There are no restrictions for this procedure. You may get an IV, if needed, to receive an ultrasound enhancing agent through to better visualize your heart.    Your physician has recommended that you wear an 14 DAY ZIO event monitor. Event monitors are medical devices that record the heart's electrical activity. Doctors most often us these monitors to diagnose arrhythmias. Arrhythmias are problems with the speed or rhythm of the heartbeat. The monitor is a small, portable device. You can wear one while you do your normal daily activities. This is usually used to diagnose what is causing palpitations/syncope (passing out). A Zio Patch Event Heart monitor will be applied to your chest today.  You will wear the patch for 14 days. After 24 hours, you may shower with the heart monitor on. If you feel any symptoms, you may press and release the button in the middle of the monitor.    Follow-Up: At [facility], you and your health needs  are our priority.  As part of our continuing mission to provide you with exceptional heart care, we have created designated Provider Care Teams.  These Care Teams include your primary Cardiologist (physician) and Advanced Practice Providers (APPs -  Physician Assistants and Nurse Practitioners) who all work together to provide you with the care you need, when you need it. You will need a follow up appointment in 6 weeks with Dr End or APP.  Please call our office 2 months in advance to schedule this appointment.  You may see DR Cristal DeerHRISTOPHER END or one of the following Advanced Practice Providers on your designated Care Team:   Nicolasa Duckinghristopher Berge, NP Eula Listenyan Dunn, PA-C . Marisue IvanJacquelyn Visser, PA-C     Echocardiogram An echocardiogram is a procedure that uses painless sound waves (ultrasound) to produce an image of the heart. Images from an echocardiogram can provide important information about:  Signs of coronary artery disease (CAD).  Aneurysm detection. An aneurysm is a weak or damaged part of an artery wall that bulges out from the normal force of blood pumping through the body.  Heart size and shape. Changes in the size or shape of the heart can be associated with certain conditions, including heart failure, aneurysm, and CAD.  Heart muscle function.  Heart valve function.  Signs of a past heart attack.  Fluid buildup around the heart.  Thickening of the heart muscle.  A tumor or infectious growth around the heart valves. Tell a health care provider about:  Any allergies you have.  All medicines  you are taking, including vitamins, herbs, eye drops, creams, and over-the-counter medicines.  Any blood disorders you have.  Any surgeries you have had.  Any medical conditions you have.  Whether you are pregnant or may be pregnant. What are the risks? Generally, this is a safe procedure. However, problems may occur, including:  Allergic reaction to dye (contrast) that may be used  during the procedure. What happens before the procedure? No specific preparation is needed. You may eat and drink normally. What happens during the procedure?   An IV tube may be inserted into one of your veins.  You may receive contrast through this tube. A contrast is an injection that improves the quality of the pictures from your heart.  A gel will be applied to your chest.  A wand-like tool (transducer) will be moved over your chest. The gel will help to transmit the sound waves from the transducer.  The sound waves will harmlessly bounce off of your heart to allow the heart images to be captured in real-time motion. The images will be recorded on a computer. The procedure may vary among health care providers and hospitals. What happens after the procedure?  You may return to your normal, everyday life, including diet, activities, and medicines, unless your health care provider tells you not to do that. Summary  An echocardiogram is a procedure that uses painless sound waves (ultrasound) to produce an image of the heart.  Images from an echocardiogram can provide important information about the size and shape of your heart, heart muscle function, heart valve function, and fluid buildup around your heart.  You do not need to do anything to prepare before this procedure. You may eat and drink normally.  After the echocardiogram is completed, you may return to your normal, everyday life, unless your health care provider tells you not to do that. This information is not intended to replace advice given to you by your health care provider. Make sure you discuss any questions you have with your health care provider. Document Released: 09/05/2000 Document Revised: 12/30/2018 Document Reviewed: 10/11/2016 Elsevier Patient Education  2020 [employer].

## 2019-05-04 ENCOUNTER — Other Ambulatory Visit: Payer: Self-pay

## 2019-05-10 ENCOUNTER — Other Ambulatory Visit: Payer: Disability Insurance

## 2019-05-20 ENCOUNTER — Ambulatory Visit: Payer: Disability Insurance | Admitting: Internal Medicine

## 2019-06-21 ENCOUNTER — Other Ambulatory Visit: Payer: Disability Insurance

## 2019-06-22 ENCOUNTER — Ambulatory Visit: Payer: Disability Insurance | Admitting: Internal Medicine

## 2019-06-22 NOTE — Progress Notes (Deleted)
   Follow-up Outpatient Visit Date: 06/22/2019  Primary Care Provider: Herminio Commons, [place] [address]  Chief Complaint: ***  HPI:  Samuel Brady is a 63 y.o. year-old male with history of hypertension, hyperlipidemia, and diabetes mellitus, who presents for follow-up of right arm pain and palpitations.  Echocardiogram and 14-day event monitor were ordered for assessment of palpitations and atypical chest pain, though they were never scheduled by the patient.  --------------------------------------------------------------------------------------------------  Cardiovascular History & Procedures: Cardiovascular Problems:  Palpitations  Atypical chest pain  Risk Factors:  Hypertension, hyperlipidemia, diabetes mellitus, male gender, prior tobacco use, and age greater than 10.  Cath/PCI:  None  CV Surgery:  None  EP Procedures and Devices:  None  Non-Invasive Evaluation(s):  None  Recent CV Pertinent Labs: Lab Results  Component Value Date   K 3.9 11/02/2017   BUN 25 (H) 11/02/2017   CREATININE 1.30 (H) 11/02/2017    Past medical and surgical history were reviewed and updated in EPIC.  No outpatient medications have been marked as taking for the 06/22/19 encounter (Appointment) with Jejuan Scala, Harrell Gave, MD.    Allergies: Patient has no known allergies.  Social History   Tobacco Use  . Smoking status: Former Research scientist (life sciences)  . Smokeless tobacco: Never Used  . Tobacco comment: Pt quit 20+yrs ago  Substance Use Topics  . Alcohol use: No  . Drug use: No    Family History  Problem Relation Age of Onset  . Heart disease Mother        Patient was [age] year old; died while pregnant.  Patient told it may have been a heart attack.  Marland Kitchen Heart Problems Sister   . Heart attack Brother 102  . Brain cancer Sister   . Diabetes Brother     Review of Systems: A 12-system review of systems was performed and was negative except as noted in the  HPI.  --------------------------------------------------------------------------------------------------  Physical Exam: There were no vitals taken for this visit.  General:  *** HEENT: No conjunctival pallor or scleral icterus. Moist mucous membranes.  OP clear. Neck: Supple without lymphadenopathy, thyromegaly, JVD, or HJR. No carotid bruit. Lungs: Normal work of breathing. Clear to auscultation bilaterally without wheezes or crackles. Heart: Regular rate and rhythm without murmurs, rubs, or gallops. Non-displaced PMI. Abd: Bowel sounds present. Soft, NT/ND without hepatosplenomegaly Ext: No lower extremity edema. Radial, PT, and DP pulses are 2+ bilaterally. Skin: Warm and dry without rash.  EKG:  ***  Lab Results  Component Value Date   WBC 13.3 (H) 11/02/2017   HGB 14.0 11/02/2017   HCT 42.7 11/02/2017   MCV 75.5 (L) 11/02/2017   PLT 192 11/02/2017    Lab Results  Component Value Date   NA 137 11/02/2017   K 3.9 11/02/2017   CL 102 11/02/2017   CO2 23 11/02/2017   BUN 25 (H) 11/02/2017   CREATININE 1.30 (H) 11/02/2017   GLUCOSE 156 (H) 11/02/2017   ALT 23 11/02/2017    No results found for: CHOL, HDL, LDLCALC, LDLDIRECT, TRIG, CHOLHDL  --------------------------------------------------------------------------------------------------  ASSESSMENT AND PLAN: ***  Nelva Bush, MD 06/22/2019 8:09 AM

## 2019-10-12 IMAGING — CR RIGHT SHOULDER - 2+ VIEW
3 series · 3 of 3 positions shown · non-contrast
Comparison: None.

CLINICAL DATA: Right shoulder pain 1 month.

EXAM:
RIGHT SHOULDER - 2+ VIEW

[shoulder grashey]
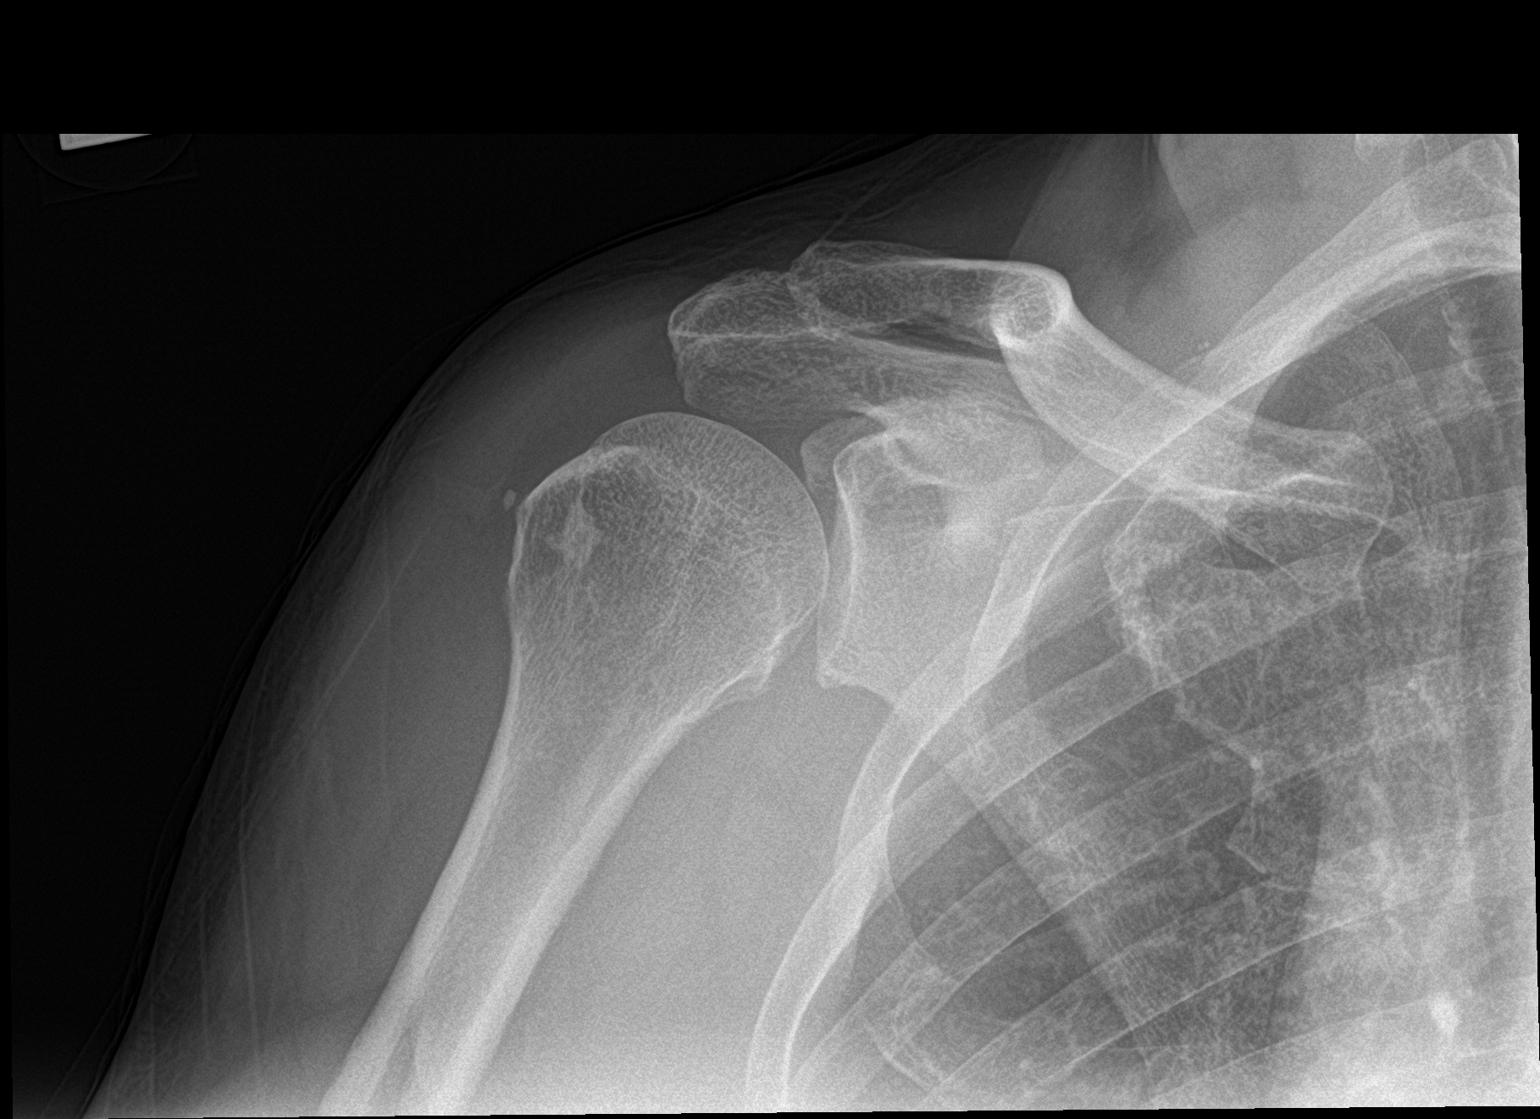

[shoulder y view]
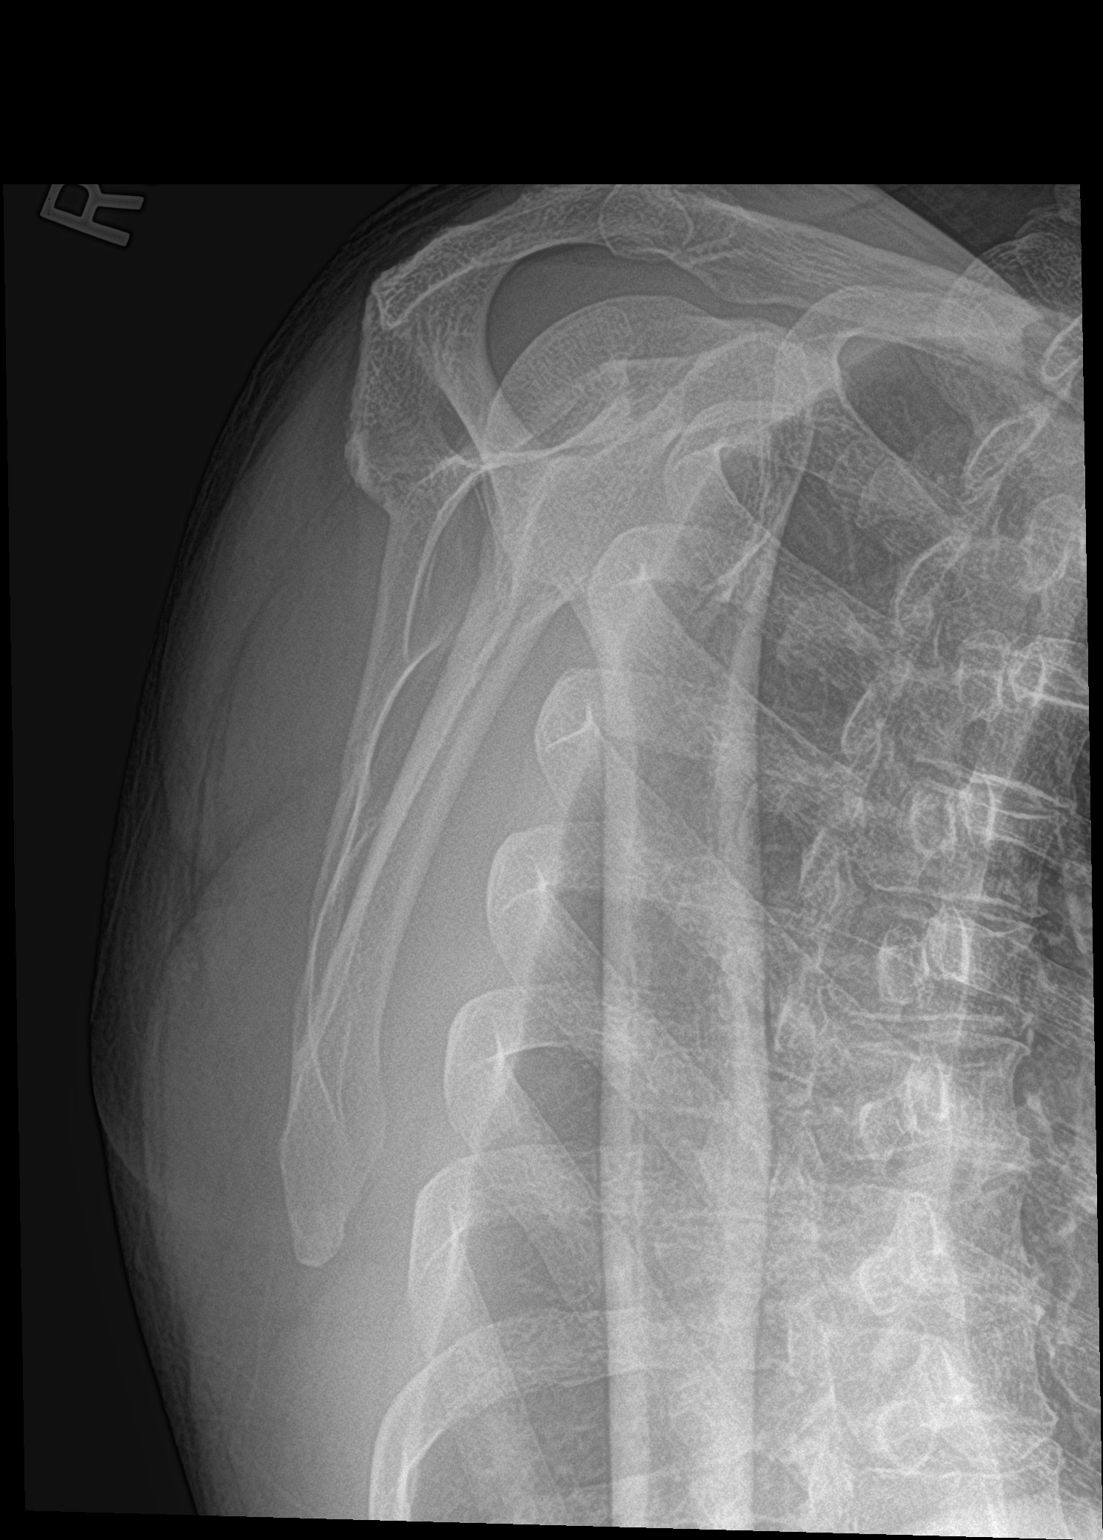

[shoulder axillary]
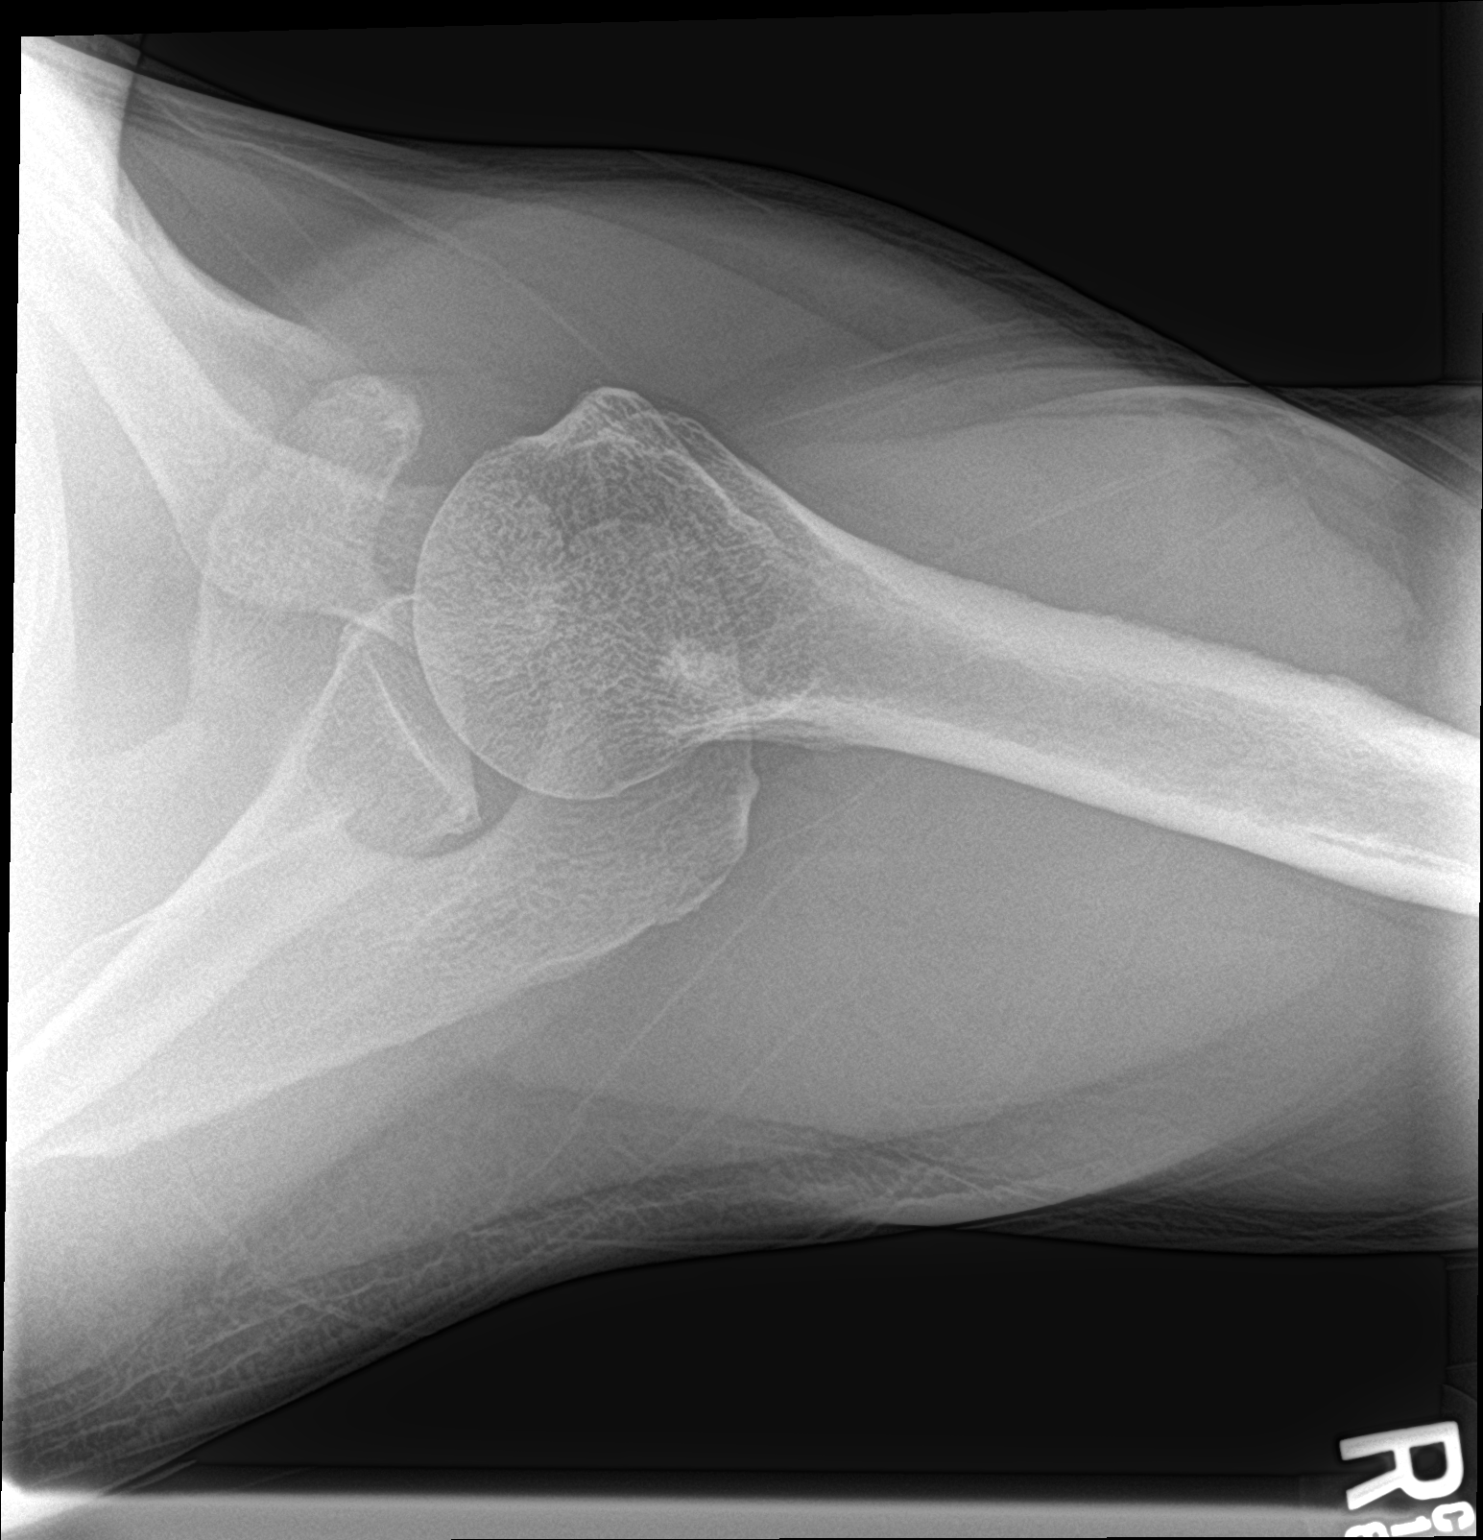

[3 of 3 positions shown; findings below may reference images not displayed]

FINDINGS: Exam demonstrates mild degenerate change of the AC joint and
glenohumeral joints. No evidence of fracture or dislocation.
IMPRESSION: No acute findings.

## 2019-11-03 ENCOUNTER — Other Ambulatory Visit: Payer: Self-pay | Admitting: Internal Medicine

## 2019-11-03 ENCOUNTER — Other Ambulatory Visit: Payer: Medicaid Other

## 2019-11-03 DIAGNOSIS — R42 Dizziness and giddiness: Secondary | ICD-10-CM

## 2020-01-04 ENCOUNTER — Other Ambulatory Visit: Payer: Medicaid Other

## 2020-02-23 ENCOUNTER — Other Ambulatory Visit: Payer: Medicaid Other

## 2020-09-12 ENCOUNTER — Telehealth: Payer: Self-pay | Admitting: Internal Medicine

## 2020-09-12 NOTE — Telephone Encounter (Signed)
Patient no show x 5 for echo   Can order be cancelled from wq ?

## 2020-09-12 NOTE — Telephone Encounter (Signed)
Per scheduler, letters have been sent to patient as well. Routing to Dr End to make him aware.

## 2023-02-23 ENCOUNTER — Other Ambulatory Visit: Payer: Self-pay | Admitting: Nurse Practitioner

## 2023-02-23 DIAGNOSIS — E119 Type 2 diabetes mellitus without complications: Secondary | ICD-10-CM

## 2023-02-23 DIAGNOSIS — F331 Major depressive disorder, recurrent, moderate: Secondary | ICD-10-CM

## 2023-02-23 DIAGNOSIS — R413 Other amnesia: Secondary | ICD-10-CM

## 2023-03-03 ENCOUNTER — Ambulatory Visit: Payer: Medicaid Other

## 2023-03-05 ENCOUNTER — Ambulatory Visit: Payer: Medicaid Other

## 2023-05-14 ENCOUNTER — Ambulatory Visit: Payer: 59 | Admitting: Nurse Practitioner

## 2023-05-21 ENCOUNTER — Ambulatory Visit: Payer: 59 | Admitting: Nurse Practitioner

## 2023-09-01 ENCOUNTER — Other Ambulatory Visit: Payer: Self-pay | Admitting: Nurse Practitioner

## 2023-09-01 DIAGNOSIS — E119 Type 2 diabetes mellitus without complications: Secondary | ICD-10-CM

## 2023-09-01 DIAGNOSIS — R413 Other amnesia: Secondary | ICD-10-CM

## 2023-09-09 ENCOUNTER — Ambulatory Visit
Admission: RE | Admit: 2023-09-09 | Discharge: 2023-09-09 | Disposition: A | Discharge status: Home / Self Care | Payer: 59 | Source: Ambulatory Visit | Attending: Nurse Practitioner | Admitting: Nurse Practitioner

## 2023-09-09 DIAGNOSIS — R413 Other amnesia: Secondary | ICD-10-CM | POA: Diagnosis present

## 2023-09-09 DIAGNOSIS — E119 Type 2 diabetes mellitus without complications: Secondary | ICD-10-CM | POA: Diagnosis present

## 2023-09-09 MED ORDER — GADOBUTROL 1 MMOL/ML IV SOLN
7.0000 mL | Freq: Once | INTRAVENOUS | Status: AC | PRN
  Administered 2023-09-09: 7 mL via INTRAVENOUS

## 2023-12-17 ENCOUNTER — Telehealth (HOSPITAL_COMMUNITY): Payer: Self-pay | Admitting: Pharmacy Technician

## 2023-12-17 ENCOUNTER — Other Ambulatory Visit (HOSPITAL_COMMUNITY): Payer: Self-pay

## 2023-12-17 ENCOUNTER — Encounter
Admission: EM | Disposition: A | Discharge status: Home / Self Care | Payer: Self-pay | Source: Home / Self Care | Attending: Student

## 2023-12-17 ENCOUNTER — Encounter: Payer: Self-pay | Admitting: Emergency Medicine

## 2023-12-17 ENCOUNTER — Inpatient Hospital Stay
Admit: 2023-12-17 | Discharge: 2023-12-17 | Disposition: A | Discharge status: Home / Self Care | Attending: Cardiology | Admitting: Cardiology

## 2023-12-17 ENCOUNTER — Inpatient Hospital Stay
Admission: EM | Admit: 2023-12-17 | Discharge: 2023-12-22 | DRG: 321 | Disposition: A | Discharge status: Home / Self Care | Attending: Student | Admitting: Student

## 2023-12-17 DIAGNOSIS — I25118 Atherosclerotic heart disease of native coronary artery with other forms of angina pectoris: Secondary | ICD-10-CM | POA: Diagnosis not present

## 2023-12-17 DIAGNOSIS — E782 Mixed hyperlipidemia: Secondary | ICD-10-CM | POA: Diagnosis not present

## 2023-12-17 DIAGNOSIS — Z833 Family history of diabetes mellitus: Secondary | ICD-10-CM | POA: Diagnosis not present

## 2023-12-17 DIAGNOSIS — Z7984 Long term (current) use of oral hypoglycemic drugs: Secondary | ICD-10-CM

## 2023-12-17 DIAGNOSIS — R059 Cough, unspecified: Secondary | ICD-10-CM | POA: Diagnosis not present

## 2023-12-17 DIAGNOSIS — E785 Hyperlipidemia, unspecified: Secondary | ICD-10-CM | POA: Diagnosis present

## 2023-12-17 DIAGNOSIS — Z79899 Other long term (current) drug therapy: Secondary | ICD-10-CM

## 2023-12-17 DIAGNOSIS — Z808 Family history of malignant neoplasm of other organs or systems: Secondary | ICD-10-CM | POA: Diagnosis not present

## 2023-12-17 DIAGNOSIS — I2510 Atherosclerotic heart disease of native coronary artery without angina pectoris: Secondary | ICD-10-CM | POA: Diagnosis present

## 2023-12-17 DIAGNOSIS — E119 Type 2 diabetes mellitus without complications: Secondary | ICD-10-CM

## 2023-12-17 DIAGNOSIS — M159 Polyosteoarthritis, unspecified: Secondary | ICD-10-CM | POA: Diagnosis present

## 2023-12-17 DIAGNOSIS — I213 ST elevation (STEMI) myocardial infarction of unspecified site: Principal | ICD-10-CM | POA: Diagnosis present

## 2023-12-17 DIAGNOSIS — E1169 Type 2 diabetes mellitus with other specified complication: Secondary | ICD-10-CM | POA: Diagnosis not present

## 2023-12-17 DIAGNOSIS — Z87891 Personal history of nicotine dependence: Secondary | ICD-10-CM | POA: Diagnosis not present

## 2023-12-17 DIAGNOSIS — I97610 Postprocedural hemorrhage and hematoma of a circulatory system organ or structure following a cardiac catheterization: Secondary | ICD-10-CM | POA: Diagnosis not present

## 2023-12-17 DIAGNOSIS — Y838 Other surgical procedures as the cause of abnormal reaction of the patient, or of later complication, without mention of misadventure at the time of the procedure: Secondary | ICD-10-CM | POA: Diagnosis not present

## 2023-12-17 DIAGNOSIS — Z603 Acculturation difficulty: Secondary | ICD-10-CM | POA: Diagnosis present

## 2023-12-17 DIAGNOSIS — I2119 ST elevation (STEMI) myocardial infarction involving other coronary artery of inferior wall: Secondary | ICD-10-CM | POA: Diagnosis present

## 2023-12-17 DIAGNOSIS — I959 Hypotension, unspecified: Secondary | ICD-10-CM | POA: Diagnosis not present

## 2023-12-17 DIAGNOSIS — I10 Essential (primary) hypertension: Secondary | ICD-10-CM | POA: Diagnosis present

## 2023-12-17 DIAGNOSIS — I2584 Coronary atherosclerosis due to calcified coronary lesion: Secondary | ICD-10-CM | POA: Diagnosis present

## 2023-12-17 DIAGNOSIS — Z8249 Family history of ischemic heart disease and other diseases of the circulatory system: Secondary | ICD-10-CM | POA: Diagnosis not present

## 2023-12-17 HISTORY — PX: LEFT HEART CATH AND CORONARY ANGIOGRAPHY: CATH118249

## 2023-12-17 HISTORY — PX: CORONARY/GRAFT ACUTE MI REVASCULARIZATION: CATH118305

## 2023-12-17 LAB — ECHOCARDIOGRAM COMPLETE
AR max vel: 2.41 cm2
AV Area VTI: 2.79 cm2
AV Area mean vel: 2.54 cm2
AV Mean grad: 2 mmHg
AV Peak grad: 3.7 mmHg
Ao pk vel: 0.97 m/s
Area-P 1/2: 3.87 cm2
Height: 66 in
S' Lateral: 2.5 cm
Weight: 2585.55 [oz_av]

## 2023-12-17 LAB — COMPREHENSIVE METABOLIC PANEL WITH GFR
ALT: 30 U/L (ref 0–44)
AST: 30 U/L (ref 15–41)
Albumin: 4 g/dL (ref 3.5–5.0)
Alkaline Phosphatase: 64 U/L (ref 38–126)
Anion gap: 5 (ref 5–15)
BUN: 18 mg/dL (ref 8–23)
CO2: 25 mmol/L (ref 22–32)
Calcium: 8.7 mg/dL — ABNORMAL LOW (ref 8.9–10.3)
Chloride: 104 mmol/L (ref 98–111)
Creatinine, Ser: 1.09 mg/dL (ref 0.61–1.24)
GFR, Estimated: >60 mL/min (ref >60)
Glucose, Bld: 168 mg/dL — ABNORMAL HIGH (ref 70–99)
Potassium: 3.9 mmol/L (ref 3.5–5.1)
Sodium: 134 mmol/L — ABNORMAL LOW (ref 135–145)
Total Bilirubin: 0.6 mg/dL (ref 0.0–1.2)
Total Protein: 7.6 g/dL (ref 6.5–8.1)

## 2023-12-17 LAB — PROTIME-INR
INR: 1 (ref 0.8–1.2)
Prothrombin Time: 13.8 s (ref 11.4–15.2)

## 2023-12-17 LAB — CBC
HCT: 38.4 % — ABNORMAL LOW (ref 39.0–52.0)
Hemoglobin: 13.4 g/dL (ref 13.0–17.0)
MCH: 25.8 pg — ABNORMAL LOW (ref 26.0–34.0)
MCHC: 34.9 g/dL (ref 30.0–36.0)
MCV: 74 fL — ABNORMAL LOW (ref 80.0–100.0)
Platelets: 235 10*3/uL (ref 150–400)
RBC: 5.19 MIL/uL (ref 4.22–5.81)
RDW: 12.6 % (ref 11.5–15.5)
WBC: 9.3 10*3/uL (ref 4.0–10.5)
nRBC: 0 % (ref 0.0–0.2)

## 2023-12-17 LAB — HEMOGLOBIN A1C
Hgb A1c MFr Bld: 7.5 % — ABNORMAL HIGH (ref 4.8–5.6)
Mean Plasma Glucose: 168.55 mg/dL

## 2023-12-17 LAB — CBC WITH DIFFERENTIAL/PLATELET
Abs Immature Granulocytes: 0.1 10*3/uL — ABNORMAL HIGH (ref 0.00–0.07)
Basophils Absolute: 0.1 10*3/uL (ref 0.0–0.1)
Basophils Relative: 1 %
Eosinophils Absolute: 0.2 10*3/uL (ref 0.0–0.5)
Eosinophils Relative: 3 %
HCT: 39.2 % (ref 39.0–52.0)
Hemoglobin: 13.3 g/dL (ref 13.0–17.0)
Immature Granulocytes: 1 %
Lymphocytes Relative: 36 %
Lymphs Abs: 3.1 10*3/uL (ref 0.7–4.0)
MCH: 25.6 pg — ABNORMAL LOW (ref 26.0–34.0)
MCHC: 33.9 g/dL (ref 30.0–36.0)
MCV: 75.4 fL — ABNORMAL LOW (ref 80.0–100.0)
Monocytes Absolute: 0.8 10*3/uL (ref 0.1–1.0)
Monocytes Relative: 10 %
Neutro Abs: 4.2 10*3/uL (ref 1.7–7.7)
Neutrophils Relative %: 49 %
Platelets: 224 10*3/uL (ref 150–400)
RBC: 5.2 MIL/uL (ref 4.22–5.81)
RDW: 12.6 % (ref 11.5–15.5)
WBC: 8.5 10*3/uL (ref 4.0–10.5)
nRBC: 0 % (ref 0.0–0.2)

## 2023-12-17 LAB — GLUCOSE, CAPILLARY
Glucose-Capillary: 222 mg/dL — ABNORMAL HIGH (ref 70–99)
Glucose-Capillary: 122 mg/dL — ABNORMAL HIGH (ref 70–99)
Glucose-Capillary: 124 mg/dL — ABNORMAL HIGH (ref 70–99)
Glucose-Capillary: 166 mg/dL — ABNORMAL HIGH (ref 70–99)
Glucose-Capillary: 130 mg/dL — ABNORMAL HIGH (ref 70–99)

## 2023-12-17 LAB — LACTIC ACID, PLASMA: Lactic Acid, Venous: 1.7 mmol/L (ref 0.5–1.9)

## 2023-12-17 LAB — LIPID PANEL
Cholesterol: 161 mg/dL (ref 0–200)
HDL: 32 mg/dL — ABNORMAL LOW (ref >40)
LDL Cholesterol: 80 mg/dL (ref 0–99)
Total CHOL/HDL Ratio: 5 ratio
Triglycerides: 245 mg/dL — ABNORMAL HIGH (ref <150)
VLDL: 49 mg/dL — ABNORMAL HIGH (ref 0–40)

## 2023-12-17 LAB — APTT: aPTT: 29 s (ref 24–36)

## 2023-12-17 LAB — BASIC METABOLIC PANEL WITH GFR
Anion gap: 8 (ref 5–15)
BUN: 17 mg/dL (ref 8–23)
CO2: 23 mmol/L (ref 22–32)
Calcium: 9 mg/dL (ref 8.9–10.3)
Chloride: 104 mmol/L (ref 98–111)
Creatinine, Ser: 1.02 mg/dL (ref 0.61–1.24)
GFR, Estimated: >60 mL/min (ref >60)
Glucose, Bld: 140 mg/dL — ABNORMAL HIGH (ref 70–99)
Potassium: 4.2 mmol/L (ref 3.5–5.1)
Sodium: 135 mmol/L (ref 135–145)

## 2023-12-17 LAB — BRAIN NATRIURETIC PEPTIDE: B Natriuretic Peptide: 4.6 pg/mL (ref 0.0–100.0)

## 2023-12-17 LAB — TROPONIN I (HIGH SENSITIVITY)
Troponin I (High Sensitivity): 285 ng/L (ref <18)
Troponin I (High Sensitivity): 316 ng/L (ref <18)

## 2023-12-17 LAB — HIV ANTIBODY (ROUTINE TESTING W REFLEX): HIV Screen 4th Generation wRfx: NONREACTIVE

## 2023-12-17 LAB — MRSA NEXT GEN BY PCR, NASAL: MRSA by PCR Next Gen: NOT DETECTED

## 2023-12-17 LAB — MAGNESIUM: Magnesium: 2.1 mg/dL (ref 1.7–2.4)

## 2023-12-17 SURGERY — CORONARY/GRAFT ACUTE MI REVASCULARIZATION
Anesthesia: Moderate Sedation

## 2023-12-17 MED ORDER — MIDAZOLAM HCL 2 MG/2ML IJ SOLN
INTRAMUSCULAR | Status: DC | PRN
  Administered 2023-12-17: 1 mg via INTRAVENOUS

## 2023-12-17 MED ORDER — METOPROLOL TARTRATE 25 MG PO TABS
25.0000 mg | ORAL_TABLET | Freq: Two times a day (BID) | ORAL | Status: DC
  Administered 2023-12-17 (×2): 25 mg via ORAL
  Filled 2023-12-17 (×2): qty 1

## 2023-12-17 MED ORDER — SODIUM CHLORIDE 0.9% FLUSH: 3.0000 mL | INTRAVENOUS | Status: DC | PRN

## 2023-12-17 MED ORDER — TICAGRELOR 90 MG PO TABS
ORAL_TABLET | ORAL | Status: AC
  Filled 2023-12-17: qty 2

## 2023-12-17 MED ORDER — HEPARIN (PORCINE) IN NACL 1000-0.9 UT/500ML-% IV SOLN
INTRAVENOUS | Status: AC
  Filled 2023-12-17: qty 1000

## 2023-12-17 MED ORDER — LISINOPRIL 5 MG PO TABS
20.0000 mg | ORAL_TABLET | Freq: Every day | ORAL | Status: DC
  Administered 2023-12-17 – 2023-12-22 (×5): 20 mg via ORAL
  Filled 2023-12-17 (×5): qty 4

## 2023-12-17 MED ORDER — ENOXAPARIN SODIUM 40 MG/0.4ML IJ SOSY
40.0000 mg | PREFILLED_SYRINGE | INTRAMUSCULAR | Status: DC
  Administered 2023-12-17 – 2023-12-20 (×4): 40 mg via SUBCUTANEOUS
  Filled 2023-12-17 (×4): qty 0.4

## 2023-12-17 MED ORDER — HEPARIN (PORCINE) IN NACL 1000-0.9 UT/500ML-% IV SOLN
INTRAVENOUS | Status: DC | PRN
  Administered 2023-12-17 (×2): 500 mL

## 2023-12-17 MED ORDER — ASPIRIN 81 MG PO CHEW
81.0000 mg | CHEWABLE_TABLET | Freq: Every day | ORAL | Status: DC
  Administered 2023-12-18 – 2023-12-22 (×5): 81 mg via ORAL
  Filled 2023-12-17 (×5): qty 1

## 2023-12-17 MED ORDER — METOPROLOL SUCCINATE ER 50 MG PO TB24: 25.0000 mg | ORAL_TABLET | Freq: Every day | ORAL | Status: DC

## 2023-12-17 MED ORDER — HEPARIN SODIUM (PORCINE) 1000 UNIT/ML IJ SOLN
INTRAMUSCULAR | Status: DC | PRN
  Administered 2023-12-17 (×2): 3000 [IU] via INTRAVENOUS
  Administered 2023-12-17: 4000 [IU] via INTRAVENOUS

## 2023-12-17 MED ORDER — VERAPAMIL HCL 2.5 MG/ML IV SOLN
INTRAVENOUS | Status: DC | PRN
Start: 2023-12-17 — End: 2023-12-17
  Administered 2023-12-17: 2.5 mg via INTRAVENOUS

## 2023-12-17 MED ORDER — TICAGRELOR 90 MG PO TABS
90.0000 mg | ORAL_TABLET | Freq: Two times a day (BID) | ORAL | Status: DC
  Administered 2023-12-17 – 2023-12-22 (×10): 90 mg via ORAL
  Filled 2023-12-17 (×10): qty 1

## 2023-12-17 MED ORDER — INSULIN ASPART 100 UNIT/ML IJ SOLN
0.0000 [IU] | Freq: Three times a day (TID) | INTRAMUSCULAR | Status: DC
  Administered 2023-12-17 (×2): 2 [IU] via SUBCUTANEOUS
  Administered 2023-12-17: 3 [IU] via SUBCUTANEOUS
  Administered 2023-12-18: 2 [IU] via SUBCUTANEOUS
  Administered 2023-12-18: 3 [IU] via SUBCUTANEOUS
  Administered 2023-12-19: 2 [IU] via SUBCUTANEOUS
  Administered 2023-12-19: 3 [IU] via SUBCUTANEOUS
  Administered 2023-12-20 (×2): 2 [IU] via SUBCUTANEOUS
  Administered 2023-12-20: 8 [IU] via SUBCUTANEOUS
  Administered 2023-12-21 (×3): 2 [IU] via SUBCUTANEOUS
  Filled 2023-12-17 (×14): qty 1

## 2023-12-17 MED ORDER — HEPARIN SODIUM (PORCINE) 1000 UNIT/ML IJ SOLN
INTRAMUSCULAR | Status: AC
Start: 2023-12-17 — End: ?
  Filled 2023-12-17: qty 10

## 2023-12-17 MED ORDER — NITROGLYCERIN 0.4 MG SL SUBL
0.4000 mg | SUBLINGUAL_TABLET | SUBLINGUAL | Status: DC | PRN
  Administered 2023-12-17: 0.4 mg via SUBLINGUAL
  Filled 2023-12-17: qty 1

## 2023-12-17 MED ORDER — HEPARIN SODIUM (PORCINE) 5000 UNIT/ML IJ SOLN: 60.0000 [IU]/kg | Freq: Once | INTRAMUSCULAR | Status: DC

## 2023-12-17 MED ORDER — IOHEXOL 300 MG/ML  SOLN
INTRAMUSCULAR | Status: DC | PRN
Start: 2023-12-17 — End: 2023-12-17
  Administered 2023-12-17: 150 mL

## 2023-12-17 MED ORDER — NITROGLYCERIN 1 MG/10 ML FOR IR/CATH LAB
INTRA_ARTERIAL | Status: DC | PRN
  Administered 2023-12-17: 200 ug

## 2023-12-17 MED ORDER — LIDOCAINE HCL 1 % IJ SOLN
INTRAMUSCULAR | Status: AC
  Filled 2023-12-17: qty 20

## 2023-12-17 MED ORDER — MIDAZOLAM HCL 2 MG/2ML IJ SOLN
INTRAMUSCULAR | Status: AC
  Filled 2023-12-17: qty 2

## 2023-12-17 MED ORDER — SODIUM CHLORIDE 0.9% FLUSH
3.0000 mL | Freq: Two times a day (BID) | INTRAVENOUS | Status: DC
  Administered 2023-12-17 – 2023-12-20 (×8): 3 mL via INTRAVENOUS

## 2023-12-17 MED ORDER — ORAL CARE MOUTH RINSE: 15.0000 mL | OROMUCOSAL | Status: DC | PRN

## 2023-12-17 MED ORDER — SODIUM CHLORIDE 0.9 % WEIGHT BASED INFUSION
1.0000 mL/kg/h | INTRAVENOUS | Status: AC
  Administered 2023-12-17: 1 mL/kg/h via INTRAVENOUS

## 2023-12-17 MED ORDER — ONDANSETRON HCL 4 MG/2ML IJ SOLN
4.0000 mg | Freq: Four times a day (QID) | INTRAMUSCULAR | Status: DC | PRN
  Administered 2023-12-21: 4 mg via INTRAVENOUS
  Filled 2023-12-17: qty 2

## 2023-12-17 MED ORDER — TICAGRELOR 90 MG PO TABS
ORAL_TABLET | ORAL | Status: DC | PRN
  Administered 2023-12-17: 180 mg via ORAL

## 2023-12-17 MED ORDER — SODIUM CHLORIDE 0.9 % IV SOLN: 250.0000 mL | INTRAVENOUS | Status: AC | PRN

## 2023-12-17 MED ORDER — INSULIN ASPART 100 UNIT/ML IJ SOLN
0.0000 [IU] | Freq: Every day | INTRAMUSCULAR | Status: DC
Start: 2023-12-17 — End: 2023-12-22
  Administered 2023-12-17 – 2023-12-18 (×2): 2 [IU] via SUBCUTANEOUS
  Filled 2023-12-17 (×2): qty 1

## 2023-12-17 MED ORDER — HEPARIN SODIUM (PORCINE) 5000 UNIT/ML IJ SOLN
4000.0000 [IU] | Freq: Once | INTRAMUSCULAR | Status: AC
  Administered 2023-12-17: 4000 [IU] via INTRAVENOUS
  Filled 2023-12-17: qty 1

## 2023-12-17 MED ORDER — CHLORHEXIDINE GLUCONATE CLOTH 2 % EX PADS
6.0000 | MEDICATED_PAD | Freq: Every day | CUTANEOUS | Status: DC
  Administered 2023-12-17 – 2023-12-21 (×5): 6 via TOPICAL

## 2023-12-17 MED ORDER — NITROGLYCERIN 1 MG/10 ML FOR IR/CATH LAB
INTRA_ARTERIAL | Status: AC
  Filled 2023-12-17: qty 10

## 2023-12-17 MED ORDER — ACETAMINOPHEN 325 MG PO TABS
650.0000 mg | ORAL_TABLET | ORAL | Status: DC | PRN
  Administered 2023-12-21: 650 mg via ORAL
  Filled 2023-12-17: qty 2

## 2023-12-17 MED ORDER — FENTANYL CITRATE (PF) 100 MCG/2ML IJ SOLN
INTRAMUSCULAR | Status: DC | PRN
  Administered 2023-12-17: 25 ug via INTRAVENOUS

## 2023-12-17 MED ORDER — SODIUM CHLORIDE 0.9 % IV SOLN: INTRAVENOUS | Status: AC

## 2023-12-17 MED ORDER — ATORVASTATIN CALCIUM 80 MG PO TABS
80.0000 mg | ORAL_TABLET | Freq: Every day | ORAL | Status: DC
  Administered 2023-12-17 – 2023-12-22 (×6): 80 mg via ORAL
  Filled 2023-12-17 (×6): qty 1

## 2023-12-17 MED ORDER — FENTANYL CITRATE (PF) 100 MCG/2ML IJ SOLN
INTRAMUSCULAR | Status: AC
  Filled 2023-12-17: qty 2

## 2023-12-17 MED ORDER — HYDRALAZINE HCL 20 MG/ML IJ SOLN: 10.0000 mg | INTRAMUSCULAR | Status: AC | PRN

## 2023-12-17 MED ORDER — VERAPAMIL HCL 2.5 MG/ML IV SOLN
INTRAVENOUS | Status: AC
  Filled 2023-12-17: qty 2

## 2023-12-17 MED ORDER — LABETALOL HCL 5 MG/ML IV SOLN: 10.0000 mg | INTRAVENOUS | Status: AC | PRN

## 2023-12-17 MED ORDER — TICAGRELOR 90 MG PO TABS: 90.0000 mg | ORAL_TABLET | Freq: Two times a day (BID) | ORAL | Status: DC

## 2023-12-17 MED ORDER — LIDOCAINE HCL (PF) 1 % IJ SOLN
INTRAMUSCULAR | Status: DC | PRN
Start: 2023-12-17 — End: 2023-12-17
  Administered 2023-12-17: 2 mL

## 2023-12-17 SURGICAL SUPPLY — 22 items
BALLN TREK RX 2.25X12 (BALLOONS) ×1 IMPLANT
BALLN ~~LOC~~ TREK NEO RX 3.0X15 (BALLOONS) ×1 IMPLANT
BALLOON TREK RX 2.25X12 (BALLOONS) IMPLANT
BALLOON ~~LOC~~ TREK NEO RX 3.0X15 (BALLOONS) IMPLANT
CATH INFINITI 5 FR JL3.5 (CATHETERS) IMPLANT
CATH INFINITI 5FR ANG PIGTAIL (CATHETERS) IMPLANT
CATH VISTA GUIDE 6FR JR4 ECOPK (CATHETERS) IMPLANT
CATH VISTA GUIDE 6FR JR4 SH (CATHETERS) IMPLANT
DEVICE RAD TR BAND REGULAR (VASCULAR PRODUCTS) IMPLANT
DRAPE BRACHIAL (DRAPES) IMPLANT
GLIDESHEATH SLEND SS 6F .021 (SHEATH) IMPLANT
GUIDEWIRE INQWIRE 1.5J.035X260 (WIRE) IMPLANT
INQWIRE 1.5J .035X260CM (WIRE) ×1 IMPLANT
KIT ENCORE 26 ADVANTAGE (KITS) IMPLANT
KIT ESSENTIALS PG (KITS) IMPLANT
PACK CARDIAC CATH (CUSTOM PROCEDURE TRAY) ×1 IMPLANT
PROTECTION STATION PRESSURIZED (MISCELLANEOUS) ×1 IMPLANT
SET ATX-X65L (MISCELLANEOUS) IMPLANT
STATION PROTECTION PRESSURIZED (MISCELLANEOUS) IMPLANT
STENT ONYX FRONTIER 2.75X22 (Permanent Stent) IMPLANT
TUBING CIL FLEX 10 FLL-RA (TUBING) IMPLANT
WIRE ASAHI PROWATER 180CM (WIRE) IMPLANT

## 2023-12-17 NOTE — ED Notes (Signed)
 IN CARDIAC CATH LAB @ THIS TIME

## 2023-12-17 NOTE — ED Triage Notes (Signed)
 Pt arrived via [facility] from home as a "CODE STEMI" called into [facility] by EMS. EMS report: pt speaks Cambodian/Khmer, information was provided by his son, who reported pt with syncopal episodes last month. Tonight c/o left sided, non-radiating chest pain with accompanying SOB. Pt received 324mg  Aspirin and 1 spray of nitroglycerin in route to ED.    CBG: 324 (HX/O DM) EMS VS:  BP: 181/98 HR: 92 O2: 98% RA

## 2023-12-17 NOTE — Assessment & Plan Note (Signed)
 High dose Lipitor

## 2023-12-17 NOTE — ED Notes (Signed)
 Cardiologist Cassie Freer, MD) into pts room @ this time [phone])

## 2023-12-17 NOTE — Plan of Care (Signed)
  Problem: Education: Goal: Knowledge of General Education information will improve Description: Including pain rating scale, medication(s)/side effects and non-pharmacologic comfort measures Outcome: Progressing   Problem: Health Behavior/Discharge Planning: Goal: Ability to manage health-related needs will improve Outcome: Progressing   Problem: Clinical Measurements: Goal: Ability to maintain clinical measurements within normal limits will improve Outcome: Progressing Goal: Will remain free from infection Outcome: Progressing Goal: Diagnostic test results will improve Outcome: Progressing Goal: Respiratory complications will improve Outcome: Progressing Goal: Cardiovascular complication will be avoided Outcome: Progressing   Problem: Activity: Goal: Risk for activity intolerance will decrease Outcome: Progressing   Problem: Nutrition: Goal: Adequate nutrition will be maintained Outcome: Progressing   Problem: Coping: Goal: Level of anxiety will decrease Outcome: Progressing   Problem: Elimination: Goal: Will not experience complications related to bowel motility Outcome: Progressing Goal: Will not experience complications related to urinary retention Outcome: Progressing   Problem: Pain Managment: Goal: General experience of comfort will improve and/or be controlled Outcome: Progressing   Problem: Safety: Goal: Ability to remain free from injury will improve Outcome: Progressing   Problem: Skin Integrity: Goal: Risk for impaired skin integrity will decrease Outcome: Progressing   Problem: Education: Goal: Understanding of CV disease, CV risk reduction, and recovery process will improve Outcome: Progressing Goal: Individualized Educational Video(s) Outcome: Progressing   Problem: Activity: Goal: Ability to return to baseline activity level will improve Outcome: Progressing   Problem: Cardiovascular: Goal: Ability to achieve and maintain adequate  cardiovascular perfusion will improve Outcome: Progressing Goal: Vascular access site(s) Level 0-1 will be maintained Outcome: Progressing   Problem: Health Behavior/Discharge Planning: Goal: Ability to safely manage health-related needs after discharge will improve Outcome: Progressing   Problem: Education: Goal: Ability to describe self-care measures that may prevent or decrease complications (Diabetes Survival Skills Education) will improve Outcome: Progressing Goal: Individualized Educational Video(s) Outcome: Progressing   Problem: Coping: Goal: Ability to adjust to condition or change in health will improve Outcome: Progressing   Problem: Fluid Volume: Goal: Ability to maintain a balanced intake and output will improve Outcome: Progressing   Problem: Health Behavior/Discharge Planning: Goal: Ability to identify and utilize available resources and services will improve Outcome: Progressing Goal: Ability to manage health-related needs will improve Outcome: Progressing   Problem: Metabolic: Goal: Ability to maintain appropriate glucose levels will improve Outcome: Progressing   Problem: Nutritional: Goal: Maintenance of adequate nutrition will improve Outcome: Progressing Goal: Progress toward achieving an optimal weight will improve Outcome: Progressing   Problem: Skin Integrity: Goal: Risk for impaired skin integrity will decrease Outcome: Progressing   Problem: Tissue Perfusion: Goal: Adequacy of tissue perfusion will improve Outcome: Progressing   Problem: Education: Goal: Understanding of cardiac disease, CV risk reduction, and recovery process will improve Outcome: Progressing Goal: Individualized Educational Video(s) Outcome: Progressing   Problem: Activity: Goal: Ability to tolerate increased activity will improve Outcome: Progressing   Problem: Cardiac: Goal: Ability to achieve and maintain adequate cardiovascular perfusion will improve Outcome:  Progressing   Problem: Health Behavior/Discharge Planning: Goal: Ability to safely manage health-related needs after discharge will improve Outcome: Progressing

## 2023-12-17 NOTE — Progress Notes (Signed)
 [facility] Physician-Brief Progress Note Patient Name: Samuel Brady DOB: Sep 04, 1956 MRN: [id number]   Date of Service  12/17/2023  HPI/Events of Note  Patient admitted as a Code STEMI with infero-lateral STEMI, underwent cardiac catheterization with PCI  + DES to the RCA.  eICU Interventions  New Patient Evaluation.        Samuel Brady 12/17/2023, 2:55 AM

## 2023-12-17 NOTE — H&P (Addendum)
 History and Physical    Patient: Samuel Brady WUJ:[id number] DOB: [birth year] DOA: 12/17/2023 DOS: the patient was seen and examined on 12/17/2023 PCP: Judeen Hammans, MD  Patient coming from: Home  Chief Complaint:  Chief Complaint  Patient presents with   Code STEMI   HPI: Samuel Brady is a 68 y.o. male with medical history significant of hypertension, hyperlipidemia, type 2 diabetes presenting with STEMI.  Patient reports waking up overnight with mild to severe central chest pain.  Chest pain with some radiation into the right arm.  Positive nausea and shortness of breath.  Denies any prior episodes like this in the past.  No reported tobacco or alcohol use.  Symptoms persisted at home.  Noted syncopal episode within the past month.  No focal hemiparesis or confusion.  EMS was subsequently called by patient's son.  Given full dose aspirin as well as nitroglycerin en route to the ER.  Patient does report significant family history of coronary disease. Presented to the ER afebrile, blood pressures 140s to 170s over 90s to 80s.  EKG with noted ST elevations in inferior and lateral leads.  White count 8.5, hemoglobin 13.3, platelets 224, creatinine 1.09.  Glucose 168.  Code STEMI called with patient being emergent sent to the Cath Lab.  Troponin 285-316. Coronary imaging showing:   1.  Inferior STEMI 2.  Three-vessel coronary artery disease with 80% stenosis mid LAD, 80%  stenosis mid left circumflex, and 99% stenosis of mid RCA (culprit vessel) 3.  Near normal left ventricular function with estimate LVEF 50% with mild  inferior apical hypokinesis Review of Systems: As mentioned in the history of present illness. All other systems reviewed and are negative. Past Medical History:  Diagnosis Date   Arthritis    Diabetes mellitus without complication (HCC)    Hyperlipidemia    Hypertension    Past Surgical History:  Procedure Laterality Date   CORONARY/GRAFT ACUTE MI REVASCULARIZATION N/A  12/17/2023   Procedure: Coronary/Graft Acute MI Revascularization;  Surgeon: Marcina Millard, MD;  Location: [facility] INVASIVE CV LAB;  Service: Cardiovascular;  Laterality: N/A;   LEFT HEART CATH AND CORONARY ANGIOGRAPHY N/A 12/17/2023   Procedure: LEFT HEART CATH AND CORONARY ANGIOGRAPHY;  Surgeon: Marcina Millard, MD;  Location: [facility] INVASIVE CV LAB;  Service: Cardiovascular;  Laterality: N/A;   Social History:  reports that he has quit smoking. He has never used smokeless tobacco. He reports that he does not drink alcohol and does not use drugs.  No Known Allergies  Family History  Problem Relation Age of Onset   Heart disease Mother        Patient was [age] year old; died while pregnant.  Patient told it may have been a heart attack.   Heart Problems Sister    Heart attack Brother 75   Brain cancer Sister    Diabetes Brother     Prior to Admission medications   Medication Sig Start Date End Date Taking? Authorizing Provider  acetaminophen (TYLENOL) 500 MG tablet Take 500 mg by mouth every 6 (six) hours as needed.    [provider]  atorvastatin (LIPITOR) 40 MG tablet Take 40 mg by mouth at bedtime.    [provider]  benzonatate (TESSALON) 100 MG capsule Take 100 mg by mouth 3 (three) times daily as needed for cough.    [provider]  clotrimazole (LOTRIMIN) 1 % cream Apply 1 application topically daily.    [provider]  HYDROcodone-acetaminophen (NORCO/VICODIN) 5-325 MG tablet Take  1 tablet by mouth every 4 (four) hours as needed (breakthrough pain).    [provider]  hydrocortisone 2.5 % ointment Apply 1 application topically daily.    [provider]  ibuprofen (ADVIL,MOTRIN) 600 MG tablet Take 600 mg by mouth every 6 (six) hours as needed.    [provider]  lisinopril (PRINIVIL,ZESTRIL) 20 MG tablet Take 20 mg by mouth daily.    [provider]  loperamide (LOPERAMIDE A-D) 2 MG tablet Take 1  tablet (2 mg total) by mouth 4 (four) times daily as needed for diarrhea or loose stools. 01/28/17   Darci Current, MD  metFORMIN (GLUCOPHAGE) 500 MG tablet Take 500 mg by mouth 2 (two) times daily with a meal.    [provider]  ondansetron (ZOFRAN ODT) 4 MG disintegrating tablet Take 1 tablet (4 mg total) by mouth every 8 (eight) hours as needed for nausea or vomiting. 11/02/17   Jene Every, MD  sertraline (ZOLOFT) 50 MG tablet Take 25 mg by mouth daily.    [provider]    Physical Exam: Vitals:   12/17/23 0715 12/17/23 0730 12/17/23 0745 12/17/23 0800  BP: 133/88 134/86 (!) 142/90   Pulse: 90 89 89   Resp: 13 15 16    Temp:    (!) 97.5 F (36.4 C)  TempSrc:    Oral  SpO2: 98% 98% 100%   Weight:      Height:       Physical Exam Constitutional:      Appearance: He is normal weight.  HENT:     Head: Normocephalic and atraumatic.     Nose: Nose normal.  Eyes:     Pupils: Pupils are equal, round, and reactive to light.  Cardiovascular:     Rate and Rhythm: Normal rate and regular rhythm.  Pulmonary:     Effort: Pulmonary effort is normal.  Abdominal:     General: Bowel sounds are normal.  Musculoskeletal:        General: Normal range of motion.  Skin:    General: Skin is warm.  Neurological:     General: No focal deficit present.  Psychiatric:        Mood and Affect: Mood normal.     Data Reviewed:  There are no new results to review at this time.  CARDIAC CATHETERIZATION   Ost Cx to Prox Cx lesion is 50% stenosed.   Mid Cx to Dist Cx lesion is 80% stenosed.   Prox Cx to Mid Cx lesion is 60% stenosed.   Mid LAD lesion is 80% stenosed.   Dist LAD lesion is 75% stenosed.   Mid RCA lesion is 99% stenosed.   Prox RCA lesion is 40% stenosed.   Dist RCA lesion is 50% stenosed.   A drug-eluting stent was successfully placed using a STENT ONYX  FRONTIER 2.75X22.   Post intervention, there is a 0% residual stenosis.   The left ventricular  systolic function is normal.   The left ventricular ejection fraction is 50-55% by visual estimate.  1.  Inferior STEMI 2.  Three-vessel coronary artery disease with 80% stenosis mid LAD, 80%  stenosis mid left circumflex, and 99% stenosis of mid RCA (culprit vessel) 3.  Near normal left ventricular function with estimate LVEF 50% with mild  inferior apical hypokinesis 4.  Successful primary PCI with 2.75 x 22 mm Onyx Frontier DES mid RCA  Recommendations  1.  Dual antiplatelet therapy uninterrupted x 1 year 2.  Start  metoprolol succinate 25 mg daily 3.  Start atorvastatin 80 mg daily 4.  2D echocardiogram 5.  Staged PCI of mid LAD and mid left circumflex stenosis to be scheduled  Lab Results  Component Value Date   WBC 9.3 12/17/2023   HGB 13.4 12/17/2023   HCT 38.4 (L) 12/17/2023   MCV 74.0 (L) 12/17/2023   PLT 235 12/17/2023   Last metabolic panel Lab Results  Component Value Date   GLUCOSE 140 (H) 12/17/2023   NA 135 12/17/2023   K 4.2 12/17/2023   CL 104 12/17/2023   CO2 23 12/17/2023   BUN 17 12/17/2023   CREATININE 1.02 12/17/2023   GFRNONAA >60 12/17/2023   CALCIUM 9.0 12/17/2023   PROT 7.6 12/17/2023   ALBUMIN 4.0 12/17/2023   BILITOT 0.6 12/17/2023   ALKPHOS 64 12/17/2023   AST 30 12/17/2023   ALT 30 12/17/2023   ANIONGAP 8 12/17/2023    Assessment and Plan: STEMI (ST elevation myocardial infarction) (HCC) Moderate to severe central chest pain that woke patient up from sleep last night EKG with noted inferolateral STEMI-code STEMI activated Coronary arteriography showing 80% stenosis mid LAD, 80% stenosis in mid to distal left circumflex, 99% ulcerative stenosis mid RCA s/p placement of DES to mid RCA  On aspirin and Brilinta x 1 year uninterrupted 2D echo Risk stratification labs Staged PCI of mid LAD and possibly mid left circumflex during this hospitalization versus 1 to 2 weeks per cardiology recommendations Follow   Type 2 diabetes mellitus  (HCC) Blood sugar 160s SSI A1c Monitor  Hyperlipidemia High-dose Lipitor  Essential hypertension BP stable On ACE inhibitor Beta-blocker added Monitor    Greater than 50% was spent in counseling and coordination of care with patient Critical care time: 60 minutes    Advance Care Planning:   Code Status: Full Code   Consults: Cardiology   Family Communication: No family at the bedside   Severity of Illness: The appropriate patient status for this patient is OBSERVATION. Observation status is judged to be reasonable and necessary in order to provide the required intensity of service to ensure the patient's safety. The patient's presenting symptoms, physical exam findings, and initial radiographic and laboratory data in the context of their medical condition is felt to place them at decreased risk for further clinical deterioration. Furthermore, it is anticipated that the patient will be medically stable for discharge from the hospital within 2 midnights of admission.   Author: Floydene Flock, MD 12/17/2023 8:05 AM  For on call review [email or web address].

## 2023-12-17 NOTE — Telephone Encounter (Signed)
 Patient Product/process development scientist completed.    The patient is insured through [employer] [facility] [employer]. Patient has Medicare and is not eligible for a copay card, but may be able to apply for patient assistance or Medicare RX Payment Plan (Patient Must reach out to their plan, if eligible for payment plan), if available.    Ran test claim for Brilitna 90 mg and the current 30 day co-pay is $0.00.   This test claim was processed through [facility]- copay amounts may vary at other pharmacies due to pharmacy/plan contracts, or as the patient moves through the different stages of their insurance plan.     Roland Earl, CPHT Pharmacy Technician III Certified Patient Advocate [facility] Patient Advocate Team Direct Number: [phone]  Fax: [phone]

## 2023-12-17 NOTE — ED Provider Notes (Signed)
 [facility] Provider Note    Event Date/Time   First MD Initiated Contact with Patient 12/17/23 0036     (approximate)   History   Code STEMI  History limited by the patient speaking Khmer ([place]) negatively.  Hospital interpreter computer system is not working despite [place] trying multiple times.  I used Google translate and the patient speaks some [place].  HPI Samuel Brady is a 68 y.o. male with a history of diabetes, hypertension, and hyperlipidemia who presents with acute onset chest pain that woke him up from sleep about 30 minutes prior to him calling EMS.  According to EMS, his son, who was on the phone, also stated that the patient had passed out a few times recently, although the patient denies this, but that may be a language barrier.  Patient confirms that he is still having some pain but it is better than it was.  The patient received 324 mg aspirin and 1 spray of nitroglycerin prior to arrival.  The paramedic called me after transmitting the EKG and I confirmed my concern for acute STEMI so code STEMI was activated in the field.  Patient states he has never had a heart attack, but his mother and all of his siblings have all had heart attacks.     Physical Exam   ED Triage Vitals  Encounter Vitals Group     BP 12/17/23 0038 (!) 155/92     Systolic BP Percentile --      Diastolic BP Percentile --      Pulse Rate 12/17/23 0038 84     Resp 12/17/23 0038 15     Temp 12/17/23 0038 98.3 F (36.8 C)     Temp Source 12/17/23 0038 Oral     SpO2 12/17/23 0036 98 %     Weight 12/17/23 0043 76.2 kg (168 lb 1.6 oz)     Height 12/17/23 0043 1.651 m (5\' 5" )     Head Circumference --      Peak Flow --      Pain Score 12/17/23 0038 5     Pain Loc --      Pain Education --      Exclude from Growth Chart --       Most recent vital signs: Vitals:   12/17/23 0050 12/17/23 0100  BP: (!) 173/95 (!) 167/92  Pulse: 88 91  Resp: 16 16  Temp:    SpO2:  98% 97%    General: Awake, alert, but appears uncomfortable. CV:  Good peripheral perfusion.  Normal heart sounds, regular rate and rhythm. Resp:  Normal effort. Speaking easily and comfortably, no accessory muscle usage nor intercostal retractions.  Lungs clear to auscultation. Abd:  No distention.  No tenderness to palpation, no pulsatile abdominal masses.   ED Results / Procedures / Treatments   Labs (all labs ordered are listed, but only abnormal results are displayed) Labs Reviewed  CBC WITH DIFFERENTIAL/PLATELET - Abnormal; Notable for the following components:      Result Value   MCV 75.4 (*)    MCH 25.6 (*)    Abs Immature Granulocytes 0.10 (*)    All other components within normal limits  PROTIME-INR  APTT  COMPREHENSIVE METABOLIC PANEL  LIPID PANEL  LACTIC ACID, PLASMA  BRAIN NATRIURETIC PEPTIDE  MAGNESIUM  HEMOGLOBIN A1C  TROPONIN I (HIGH SENSITIVITY)     EKG  ED ECG REPORT I, Loleta Rose, the attending physician, personally viewed and interpreted this ECG.  Date:  12/17/2023 EKG Time: 00: 37 Rate: 86 Rhythm: normal sinus rhythm QRS Axis: normal Intervals: normal ST/T Wave abnormalities: ST segment elevation in leads II, III, and aVF, as well as V5 and V6 concerning for acute STEMI Narrative Interpretation: appears consistent with inferior/lateral STEMI    PROCEDURES:  Critical Care performed: Yes, see critical care procedure note(s)  .1-3 Lead EKG Interpretation  Performed by: Loleta Rose, MD Authorized by: Loleta Rose, MD     Interpretation: normal     ECG rate:  90   ECG rate assessment: normal     Rhythm: sinus rhythm     Ectopy: none     Conduction: normal   .Critical Care  Performed by: Loleta Rose, MD Authorized by: Loleta Rose, MD   Critical care provider statement:    Critical care time (minutes):  30   Critical care time was exclusive of:  Separately billable procedures and treating other patients   Critical care was  necessary to treat or prevent imminent or life-threatening deterioration of the following conditions:  Cardiac failure   Critical care was time spent personally by me on the following activities:  Development of treatment plan with patient or surrogate, evaluation of patient's response to treatment, examination of patient, obtaining history from patient or surrogate, ordering and performing treatments and interventions, ordering and review of laboratory studies, ordering and review of radiographic studies, pulse oximetry, re-evaluation of patient's condition and review of old charts     IMPRESSION / MDM / ASSESSMENT AND PLAN / ED COURSE  I reviewed the triage vital signs and the nursing notes.                              Differential diagnosis includes, but is not limited to, ACS, AAS, PE.  Patient's presentation is most consistent with acute presentation with potential threat to life or bodily function.  Labs/studies ordered: Hemoglobin A1c, lipid panel, BNP, lactic acid, magnesium level, high-sensitivity troponin, CMP, APTT, pro time-INR, CBC with differential, EKG  Interventions/Medications given:  Medications  0.9 %  sodium chloride infusion ( Intravenous New Bag/Given 12/17/23 0045)  heparin injection 4,000 Units (4,000 Units Intravenous Given 12/17/23 0045)    (Note:  hospital course my include additional interventions and/or labs/studies not listed above.)   Patient's prehospital EKG appears consistent with a STEMI and I recommended activating the code STEMI in the field.  Upon arrival the patient is feeling little bit better but is still having some chest pain and he has a very strong history and I have a high degree of suspicion.  Patient is being settled in now and I have ordered a heparin bolus but I will verify with pharmacy the appropriate dosing (4000 units versus 5000 units).  Holding off on infusion given that the patient will almost certainly go to the Cath Lab.  Still  working on getting the audio interpreter to work, but so far simple communication in [place] and utilizing Google translate is working adequately.  Patient does not need airway protection at this time.  The patient is on the cardiac monitor to evaluate for evidence of arrhythmia and/or significant heart rate changes.   Clinical Course as of 12/17/23 0108  Thu Dec 17, 2023  0039 Dr. Darrold Junker called me to ask about the situation and I explained to the best my ability as well as that I was concerned that this is an actual STEMI based on the prehospital EKG.  He said thank you.  Immediately after we hung up, I was handed the patient's EKG which again appears consistent with acute STEMI most notably with ST segment elevation in leads II, III, and aVF, with some reciprocal elevation in the inferior leads. [CF]  0046 Ordered 4000 units heparin bolus after consulting with Barbara Cower and the pharmacy; that should be an appropriate bolus dose for the patient's weight of 76.2 kg.  Holding off on infusion anticipating transfer to the Cath Lab once Dr. Darrold Junker has assessed the patient. [CF]  0058 Called Cath Lab, no answer at this time.  Awaiting Dr. Darrold Junker. [CF]  0101 Dr. Darrold Junker at bedside, planning to take the patient to the cath lab. [CF]  0102 Dr. Darrold Junker and patient leaving ED right now for transport to cath lab [CF]    Clinical Course User Index [CF] Loleta Rose, MD     FINAL CLINICAL IMPRESSION(S) / ED DIAGNOSES   Final diagnoses:  Acute ST elevation myocardial infarction (STEMI), unspecified artery (HCC)     Rx / DC Orders   ED Discharge Orders     None        Note:  This document was prepared using Dragon voice recognition software and may include unintentional dictation errors.   Loleta Rose, MD 12/17/23 [id number]

## 2023-12-17 NOTE — Assessment & Plan Note (Addendum)
 Moderate to severe central chest pain that woke patient up from sleep last night EKG with noted inferolateral STEMI-code STEMI activated Coronary arteriography showing 80% stenosis mid LAD, 80% stenosis in mid to distal left circumflex, 99% ulcerative stenosis mid RCA s/p placement of DES to mid RCA  On aspirin and Brilinta x 1 year uninterrupted 2D echo Risk stratification labs Staged PCI of mid LAD and possibly mid left circumflex during this hospitalization versus 1 to 2 weeks per cardiology recommendations Follow

## 2023-12-17 NOTE — Assessment & Plan Note (Signed)
 Blood sugar 160s SSI A1c Monitor

## 2023-12-17 NOTE — Progress Notes (Signed)
 Pt.c/o chest pain 0635 scale of 9 EKG done ,2L oxygen per  nasal cannula provided and 1 tab of nitroglycerin given sublingual and closely monitored. with relieved. Will report to am nurse.

## 2023-12-17 NOTE — Progress Notes (Signed)
*  PRELIMINARY RESULTS* Echocardiogram 2D Echocardiogram has been performed.  Samuel Brady 12/17/2023, 3:01 PM

## 2023-12-17 NOTE — Consult Note (Signed)
 [facility] Cardiology  CARDIOLOGY CONSULT NOTE  Patient ID: Samuel Brady MRN: [id number] DOB/AGE: 02-25-1956 68 y.o.  Admit date: 12/17/2023 Referring Physician York Cerise Primary Physician  Primary Cardiologist  Reason for Consultation inferior STEMI  HPI: 68 year old [place] gentleman referred for inferior STEMI.  History obtained from conversation from EMS and patient's son over the phone.  Patient has had a recent history of recent syncopal episodes.  The patient awoke from sleep with substernal chest pain and EMS was called.  Patient was brought to [facility] ED where ECG revealed ST elevations in leads II, III and aVF, and V4, V5 and V6 consistent with inferolateral STEMI.  The patient was brought urgently to the cardiac catheterization laboratory where coronary arteriography revealed the vessel coronary artery disease, with 80% stenosis mid LAD, 80% stenosis in mid to distal left circumflex, 99% ulcerative stenosis mid RCA likely representing the culprit lesion.  The patient underwent primary PCI receiving 2.75 x 22 mm Onyx Frontier stent in the mid RCA.  Left ventriculography revealed near normal left ventricular function, with estimate LVEF 50% with mild inferoapical hypokinesis.  Review of systems complete and found to be negative unless listed above     Past Medical History:  Diagnosis Date   Arthritis    Diabetes mellitus without complication (HCC)    Hyperlipidemia    Hypertension     History reviewed. No pertinent surgical history.  Medications Prior to Admission  Medication Sig Dispense Refill Last Dose/Taking   acetaminophen (TYLENOL) 500 MG tablet Take 500 mg by mouth every 6 (six) hours as needed.      atorvastatin (LIPITOR) 40 MG tablet Take 40 mg by mouth at bedtime.      benzonatate (TESSALON) 100 MG capsule Take 100 mg by mouth 3 (three) times daily as needed for cough.      clotrimazole (LOTRIMIN) 1 % cream Apply 1 application topically daily.      HYDROcodone-acetaminophen  (NORCO/VICODIN) 5-325 MG tablet Take 1 tablet by mouth every 4 (four) hours as needed (breakthrough pain).      hydrocortisone 2.5 % ointment Apply 1 application topically daily.      ibuprofen (ADVIL,MOTRIN) 600 MG tablet Take 600 mg by mouth every 6 (six) hours as needed.      lisinopril (PRINIVIL,ZESTRIL) 20 MG tablet Take 20 mg by mouth daily.      loperamide (LOPERAMIDE A-D) 2 MG tablet Take 1 tablet (2 mg total) by mouth 4 (four) times daily as needed for diarrhea or loose stools. 30 tablet 0    metFORMIN (GLUCOPHAGE) 500 MG tablet Take 500 mg by mouth 2 (two) times daily with a meal.      ondansetron (ZOFRAN ODT) 4 MG disintegrating tablet Take 1 tablet (4 mg total) by mouth every 8 (eight) hours as needed for nausea or vomiting. 20 tablet 0    sertraline (ZOLOFT) 50 MG tablet Take 25 mg by mouth daily.      Social History   Socioeconomic History   Marital status: Married    Spouse name: Not on file   Number of children: Not on file   Years of education: Not on file   Highest education level: Not on file  Occupational History   Not on file  Tobacco Use   Smoking status: Former   Smokeless tobacco: Never   Tobacco comments:    Pt quit 20+yrs ago  Vaping Use   Vaping status: Never Used  Substance and Sexual Activity   Alcohol use: No  Drug use: No   Sexual activity: Not on file  Other Topics Concern   Not on file  Social History Narrative   Not on file   Social Drivers of Health   Financial Resource Strain: Not on file  Food Insecurity: Not on file  Transportation Needs: Not on file  Physical Activity: Not on file  Stress: Not on file  Social Connections: Not on file  Intimate Partner Violence: Not on file    Family History  Problem Relation Age of Onset   Heart disease Mother        Patient was [age] year old; died while pregnant.  Patient told it may have been a heart attack.   Heart Problems Sister    Heart attack Brother 45   Brain cancer Sister    Diabetes  Brother       Review of systems complete and found to be negative unless listed above      PHYSICAL EXAM  General: Well developed, well nourished, in no acute distress HEENT:  Normocephalic and atramatic Neck:  No JVD.  Lungs: Clear bilaterally to auscultation and percussion. Heart: HRRR . Normal S1 and S2 without gallops or murmurs.  Abdomen: Bowel sounds are positive, abdomen soft and non-tender  Msk:  Back normal, normal gait. Normal strength and tone for age. Extremities: No clubbing, cyanosis or edema.   Neuro: Alert and oriented X 3. Psych:  Good affect, responds appropriately  Labs:   Lab Results  Component Value Date   WBC 8.5 12/17/2023   HGB 13.3 12/17/2023   HCT 39.2 12/17/2023   MCV 75.4 (L) 12/17/2023   PLT 224 12/17/2023    Recent Labs  Lab 12/17/23 0040  NA 134*  K 3.9  CL 104  CO2 25  BUN 18  CREATININE 1.09  CALCIUM 8.7*  PROT 7.6  BILITOT 0.6  ALKPHOS 64  ALT 30  AST 30  GLUCOSE 168*   Lab Results  Component Value Date   TROPONINI <0.03 11/02/2017    Lab Results  Component Value Date   CHOL 161 12/17/2023   Lab Results  Component Value Date   HDL 32 (L) 12/17/2023   Lab Results  Component Value Date   LDLCALC 80 12/17/2023   Lab Results  Component Value Date   TRIG 245 (H) 12/17/2023   Lab Results  Component Value Date   CHOLHDL 5.0 12/17/2023   No results found for: "LDLDIRECT"    Radiology: CARDIAC CATHETERIZATION Result Date: 12/17/2023   Suezanne Jacquet Cx to Prox Cx lesion is 50% stenosed.   Mid Cx to Dist Cx lesion is 80% stenosed.   Prox Cx to Mid Cx lesion is 60% stenosed.   Mid LAD lesion is 80% stenosed.   Dist LAD lesion is 75% stenosed.   Mid RCA lesion is 99% stenosed.   Prox RCA lesion is 40% stenosed.   Dist RCA lesion is 50% stenosed.   A drug-eluting stent was successfully placed using a STENT ONYX FRONTIER 2.75X22.   Post intervention, there is a 0% residual stenosis.   The left ventricular systolic function is  normal.   The left ventricular ejection fraction is 50-55% by visual estimate. 1.  Inferior STEMI 2.  Three-vessel coronary artery disease with 80% stenosis mid LAD, 80% stenosis mid left circumflex, and 99% stenosis of mid RCA (culprit vessel) 3.  Near normal left ventricular function with estimate LVEF 50% with mild inferior apical hypokinesis 4.  Successful primary PCI with 2.75 x  22 mm Onyx Frontier DES mid RCA Recommendations 1.  Dual antiplatelet therapy uninterrupted x 1 year 2.  Start metoprolol succinate 25 mg daily 3.  Start atorvastatin 80 mg daily 4.  2D echocardiogram 5.  Staged PCI of mid LAD and mid left circumflex stenosis to be scheduled   EKG: Sinus rhythm with ST elevation in leads II, III and aVF, V4 V5 and V6  ASSESSMENT AND PLAN:   1.  Inferolateral STEMI, three-vessel coronary artery disease, with 80% stenosis LAD, 80% stenosis mid left circumflex, and 99% ulcerative stenosis mid RCA (culprit lesion), status post successful primary PCI with 2.75 x 22 mm Onyx frontier DES mid RCA 2.  Essential hypertension, on lisinopril 3.  Hyperlipidemia, on atorvastatin 4.  Type 2 diabetes, on metformin  Recommendations  1.  Dual antiplatelet therapy (aspirin and Brilinta) uninterrupted x 1 year 2.  Add metoprolol succinate 25 mg daily 3.  Resume home lisinopril 20 mg daily 4.  Uptitrate atorvastatin to 80 mg daily 5.  2D echocardiogram 6.  Staged PCI of mid LAD and possibly mid left circumflex during this hospitalization versus 1 to 2 weeks  Signed: Marcina Millard MD,PhD, [facility] 12/17/2023, 2:24 AM

## 2023-12-17 NOTE — Plan of Care (Signed)
  Problem: Education: Goal: Knowledge of General Education information will improve Description: Including pain rating scale, medication(s)/side effects and non-pharmacologic comfort measures Outcome: Progressing   Problem: Health Behavior/Discharge Planning: Goal: Ability to manage health-related needs will improve Outcome: Progressing   Problem: Clinical Measurements: Goal: Ability to maintain clinical measurements within normal limits will improve Outcome: Progressing Goal: Will remain free from infection Outcome: Progressing Goal: Diagnostic test results will improve Outcome: Progressing Goal: Respiratory complications will improve Outcome: Progressing Goal: Cardiovascular complication will be avoided Outcome: Progressing   Problem: Activity: Goal: Risk for activity intolerance will decrease Outcome: Progressing   Problem: Nutrition: Goal: Adequate nutrition will be maintained Outcome: Progressing   Problem: Coping: Goal: Level of anxiety will decrease Outcome: Progressing   Problem: Elimination: Goal: Will not experience complications related to bowel motility Outcome: Progressing Goal: Will not experience complications related to urinary retention Outcome: Progressing   Problem: Pain Managment: Goal: General experience of comfort will improve and/or be controlled Outcome: Progressing   Problem: Safety: Goal: Ability to remain free from injury will improve Outcome: Progressing   Problem: Skin Integrity: Goal: Risk for impaired skin integrity will decrease Outcome: Progressing   Problem: Education: Goal: Understanding of CV disease, CV risk reduction, and recovery process will improve Outcome: Progressing   Problem: Activity: Goal: Ability to return to baseline activity level will improve Outcome: Progressing   Problem: Cardiovascular: Goal: Ability to achieve and maintain adequate cardiovascular perfusion will improve Outcome: Progressing Goal:  Vascular access site(s) Level 0-1 will be maintained Outcome: Progressing   Problem: Health Behavior/Discharge Planning: Goal: Ability to safely manage health-related needs after discharge will improve Outcome: Progressing   Problem: Coping: Goal: Ability to adjust to condition or change in health will improve Outcome: Progressing   Problem: Fluid Volume: Goal: Ability to maintain a balanced intake and output will improve Outcome: Progressing   Problem: Health Behavior/Discharge Planning: Goal: Ability to manage health-related needs will improve Outcome: Progressing   Problem: Skin Integrity: Goal: Risk for impaired skin integrity will decrease Outcome: Progressing   Problem: Tissue Perfusion: Goal: Adequacy of tissue perfusion will improve Outcome: Progressing   Problem: Activity: Goal: Ability to tolerate increased activity will improve Outcome: Progressing   Problem: Cardiac: Goal: Ability to achieve and maintain adequate cardiovascular perfusion will improve Outcome: Progressing   Problem: Health Behavior/Discharge Planning: Goal: Ability to safely manage health-related needs after discharge will improve Outcome: Progressing

## 2023-12-17 NOTE — Assessment & Plan Note (Addendum)
 BP stable On ACE inhibitor Beta-blocker added Monitor

## 2023-12-17 NOTE — ED Notes (Signed)
 PT BELONGINGS: All removed and placed into bag, including pts cellular device. Bag transported with pt to cath lab and handed over to cath team.

## 2023-12-18 DIAGNOSIS — I2119 ST elevation (STEMI) myocardial infarction involving other coronary artery of inferior wall: Secondary | ICD-10-CM | POA: Diagnosis not present

## 2023-12-18 LAB — BASIC METABOLIC PANEL WITH GFR
Anion gap: 9 (ref 5–15)
BUN: 15 mg/dL (ref 8–23)
CO2: 24 mmol/L (ref 22–32)
Calcium: 9.1 mg/dL (ref 8.9–10.3)
Chloride: 105 mmol/L (ref 98–111)
Creatinine, Ser: 1.14 mg/dL (ref 0.61–1.24)
GFR, Estimated: >60 mL/min (ref >60)
Glucose, Bld: 106 mg/dL — ABNORMAL HIGH (ref 70–99)
Potassium: 3.8 mmol/L (ref 3.5–5.1)
Sodium: 138 mmol/L (ref 135–145)

## 2023-12-18 LAB — GLUCOSE, CAPILLARY
Glucose-Capillary: 146 mg/dL — ABNORMAL HIGH (ref 70–99)
Glucose-Capillary: 135 mg/dL — ABNORMAL HIGH (ref 70–99)
Glucose-Capillary: 171 mg/dL — ABNORMAL HIGH (ref 70–99)
Glucose-Capillary: 213 mg/dL — ABNORMAL HIGH (ref 70–99)

## 2023-12-18 LAB — LIPOPROTEIN A (LPA): Lipoprotein (a): 30.9 nmol/L — ABNORMAL HIGH (ref <75.0)

## 2023-12-18 MED ORDER — METOPROLOL TARTRATE 50 MG PO TABS
50.0000 mg | ORAL_TABLET | Freq: Two times a day (BID) | ORAL | Status: DC
  Administered 2023-12-18 – 2023-12-22 (×8): 50 mg via ORAL
  Filled 2023-12-18 (×8): qty 1

## 2023-12-18 NOTE — Progress Notes (Signed)
 [facility] Progress Note  Patient: Samuel Brady    NGE:[id number]  DOA: 12/17/2023     Date of Service: the patient was seen and examined on 12/18/2023  Chief Complaint  Patient presents with   Code STEMI   Brief hospital course: Damarea Merkel is a 68 y.o. male with medical history significant of hypertension, hyperlipidemia, type 2 diabetes presenting with STEMI.  Patient reports waking up overnight with mild to severe central chest pain.  Chest pain with some radiation into the right arm.  Positive nausea and shortness of breath.  Denies any prior episodes like this in the past.  No reported tobacco or alcohol use.  Symptoms persisted at home.  Noted syncopal episode within the past month.  No focal hemiparesis or confusion.  EMS was subsequently called by patient's son.  Given full dose aspirin as well as nitroglycerin en route to the ER.  Patient does report significant family history of coronary disease. Presented to the ER afebrile, blood pressures 140s to 170s over 90s to 80s.  EKG with noted ST elevations in inferior and lateral leads.  White count 8.5, hemoglobin 13.3, platelets 224, creatinine 1.09.  Glucose 168.  Code STEMI called with patient was emergently sent to the Cath Lab.  Troponin 285-316. Coronary imaging showing:  1.  Inferior STEMI 2.  Three-vessel coronary artery disease with 80% stenosis mid LAD, 80%  stenosis mid left circumflex, and 99% stenosis of mid RCA (culprit vessel) 3.  Near normal left ventricular function with estimate LVEF 50% with mild  inferior apical hypokinesis   Assessment and Plan:  # Inferior wall MI, STME # STEMI (ST elevation myocardial infarction) (HCC) Moderate to severe central chest pain that woke patient up from sleep last night EKG with noted inferolateral STEMI-code STEMI activated Coronary arteriography showing 80% stenosis mid LAD, 80% stenosis in mid to distal left circumflex, 99% ulcerative stenosis mid RCA s/p placement of DES to mid  RCA  On aspirin and Brilinta x 1 year uninterrupted Continue statin LDL 80, triglyceride 245 elevated Continue monitor on telemetry As per cardiology patient has significant CAD of LAD/LCX. Plan for staged PCI on Monday.   # Type 2 diabetes mellitus Blood sugar 160s SSI A1c 7.5 Monitor   # Hyperlipidemia High-dose Lipitor   # Essential hypertension BP stable On ACE inhibitor, continue lisinopril 10 mg p.o. daily Beta-blocker added, continue metoprolol 50 mg p.o. twice daily Monitor BP and titrate medications accordingly    Body mass index is 26.08 kg/m.  Interventions:  Diet: Heart healthy/carb modified diet DVT Prophylaxis: Subcutaneous Lovenox   Advance goals of care discussion: Full code  Family Communication: family was not present at bedside, at the time of interview.  The pt provided permission to discuss medical plan with the family. Opportunity was given to ask question and all questions were answered satisfactorily.   Disposition:  Pt is from Home, admitted with STEMI s/p RCA stent, still has significant CAD of LAD/LCx, needs staged PCI on Monday, which precludes a safe discharge. Discharge to home, when cleared by cardiology, most likely on Tuesday.  Subjective: No significant events overnight, patient denied any chest pain, no shortness of breath.  No palpitations.  Patient is aware that he needs another cardiac cath on Monday.  Physical Exam: General: NAD, lying comfortably Appear in no distress, affect appropriate Eyes: PERRLA ENT: Oral Mucosa Clear, moist  Neck: no JVD,  Cardiovascular: S1 and S2 Present, no Murmur,  Respiratory: good respiratory effort, Bilateral Air entry  equal and Decreased, no Crackles, no wheezes Abdomen: Bowel Sound present, Soft and no tenderness,  Skin: no rashes Extremities: no Pedal edema, no calf tenderness Neurologic: without any new focal findings Gait not checked due to patient safety concerns  Vitals:   12/18/23  0800 12/18/23 0847 12/18/23 0900 12/18/23 1407  BP:  135/74  123/74  Pulse: 92 84    Resp: 18 16 19  (!) 23  Temp:  98.2 F (36.8 C)  98.3 F (36.8 C)  TempSrc:  Oral  Oral  SpO2: 94% 96%  96%  Weight:      Height:        Intake/Output Summary (Last 24 hours) at 12/18/2023 1501 Last data filed at 12/18/2023 0900 Gross per 24 hour  Intake 520 ml  Output 1250 ml  Net -730 ml   Filed Weights   12/17/23 0043 12/17/23 0230  Weight: 76.2 kg 73.3 kg    Data Reviewed: I have personally reviewed and interpreted daily labs, tele strips, imagings as discussed above. I reviewed all nursing notes, pharmacy notes, vitals, pertinent old records I have discussed plan of care as described above with RN and patient/family.  CBC: Recent Labs  Lab 12/17/23 0040 12/17/23 0537  WBC 8.5 9.3  NEUTROABS 4.2  --   HGB 13.3 13.4  HCT 39.2 38.4*  MCV 75.4* 74.0*  PLT 224 235   Basic Metabolic Panel: Recent Labs  Lab 12/17/23 0040 12/17/23 0537 12/18/23 0155  NA 134* 135 138  K 3.9 4.2 3.8  CL 104 104 105  CO2 25 23 24   GLUCOSE 168* 140* 106*  BUN 18 17 15   CREATININE 1.09 1.02 1.14  CALCIUM 8.7* 9.0 9.1  MG 2.1  --   --     Studies: No results found.  Scheduled Meds:  aspirin  81 mg Oral Daily   atorvastatin  80 mg Oral Daily   Chlorhexidine Gluconate Cloth  6 each Topical Daily   enoxaparin (LOVENOX) injection  40 mg Subcutaneous Q24H   insulin aspart  0-15 Units Subcutaneous TID WC   insulin aspart  0-5 Units Subcutaneous QHS   lisinopril  20 mg Oral Daily   metoprolol tartrate  50 mg Oral BID   sodium chloride flush  3 mL Intravenous Q12H   ticagrelor  90 mg Oral BID   Continuous Infusions: PRN Meds: acetaminophen, nitroGLYCERIN, ondansetron (ZOFRAN) IV, mouth rinse, sodium chloride flush  Time spent: 35 minutes  Author: Gillis Santa. MD [facility] 12/18/2023 3:01 PM  To reach On-call, see care teams to locate the attending and reach out to them via  [email or web address]. If 7PM-7AM, please contact night-coverage If you still have difficulty reaching the attending provider, please page the [facility] (Director on Call) for [facility] on amion for assistance.

## 2023-12-18 NOTE — Progress Notes (Signed)
 Patient asked, "Is it normal to feel sad with this?" While attempting to clarify, the patient confided that he is currently feeling sad and scared and this is a new feeling. He is not having any feelings of hurting himself or others, he is just sad and scared. He is confused as to how these events have occurred as he feels like he lives a healthy lifestyle. Education was provided of risk factors and hereditary factors. Notified patient that these feelings and concerns would be passed on to the medical team.

## 2023-12-18 NOTE — Progress Notes (Signed)
 [facility] Cardiology  CARDIOLOGY CONSULT NOTE  Patient ID: Samuel Brady MRN: [id number] DOB/AGE: 66/04/1956 66 y.o.  Admit date: 12/17/2023 Referring Physician York Cerise Primary Physician  Primary Cardiologist  Reason for Consultation inferior STEMI  HPI: 68 year old [place] gentleman referred for inferior STEMI.  History obtained from conversation from EMS and patient's son over the phone.  Patient has had a recent history of recent syncopal episodes.  The patient awoke from sleep with substernal chest pain and EMS was called.  Patient was brought to [facility] ED where ECG revealed ST elevations in leads II, III and aVF, and V4, V5 and V6 consistent with inferolateral STEMI.  The patient was brought urgently to the cardiac catheterization laboratory where coronary arteriography revealed the vessel coronary artery disease, with 80% stenosis mid LAD, 80% stenosis in mid to distal left circumflex, 99% ulcerative stenosis mid RCA likely representing the culprit lesion.  The patient underwent primary PCI receiving 2.75 x 22 mm Onyx Frontier stent in the mid RCA.  Left ventriculography revealed near normal left ventricular function, with estimate LVEF 50% with mild inferoapical hypokinesis.  Interval history: -Patient seen and examined this morning, resting comfortably in ICU bed. -States that his chest pain has improved and he denies any issues this morning. -BP and heart rate remained stable overall.  Tolerating addition of metoprolol well. -Renal function remained stable.  Review of systems complete and found to be negative unless listed above     Past Medical History:  Diagnosis Date   Arthritis    Diabetes mellitus without complication (HCC)    Hyperlipidemia    Hypertension     Past Surgical History:  Procedure Laterality Date   CORONARY/GRAFT ACUTE MI REVASCULARIZATION N/A 12/17/2023   Procedure: Coronary/Graft Acute MI Revascularization;  Surgeon: Marcina Millard, MD;  Location: [facility] INVASIVE  CV LAB;  Service: Cardiovascular;  Laterality: N/A;   LEFT HEART CATH AND CORONARY ANGIOGRAPHY N/A 12/17/2023   Procedure: LEFT HEART CATH AND CORONARY ANGIOGRAPHY;  Surgeon: Marcina Millard, MD;  Location: [facility] INVASIVE CV LAB;  Service: Cardiovascular;  Laterality: N/A;    Medications Prior to Admission  Medication Sig Dispense Refill Last Dose/Taking   acetaminophen (TYLENOL) 500 MG tablet Take 500 mg by mouth every 6 (six) hours as needed.   Past Month   atorvastatin (LIPITOR) 40 MG tablet Take 40 mg by mouth at bedtime.   Past Week   lisinopril (PRINIVIL,ZESTRIL) 20 MG tablet Take 20 mg by mouth daily.   Past Week   metformin (FORTAMET) 500 MG (OSM) 24 hr tablet Take 500 mg by mouth 2 (two) times daily with a meal.   Past Week   loperamide (LOPERAMIDE A-D) 2 MG tablet Take 1 tablet (2 mg total) by mouth 4 (four) times daily as needed for diarrhea or loose stools. 30 tablet 0 Unknown   Social History   Socioeconomic History   Marital status: Married    Spouse name: Not on file   Number of children: Not on file   Years of education: Not on file   Highest education level: Not on file  Occupational History   Not on file  Tobacco Use   Smoking status: Former   Smokeless tobacco: Never   Tobacco comments:    Pt quit 20+yrs ago  Vaping Use   Vaping status: Never Used  Substance and Sexual Activity   Alcohol use: No   Drug use: No   Sexual activity: Not on file  Other Topics Concern   Not on file  Social History Narrative   Not on file   Social Drivers of Health   Financial Resource Strain: Not on file  Food Insecurity: No Food Insecurity (12/17/2023)   Hunger Vital Sign    Worried About Running Out of Food in the Last Year: Never true    Ran Out of Food in the Last Year: Never true  Transportation Needs: No Transportation Needs (12/17/2023)   PRAPARE - Administrator, Civil Service (Medical): No    Lack of Transportation (Non-Medical): No  Physical  Activity: Not on file  Stress: Not on file  Social Connections: Unknown (12/17/2023)   Social Connection and Isolation Panel [NHANES]    Frequency of Communication with Friends and Family: Three times a week    Frequency of Social Gatherings with Friends and Family: Three times a week    Attends Religious Services: Not on file    Active Member of Clubs or Organizations: Patient declined    Attends Banker Meetings: 1 to 4 times per year    Marital Status: Married  Catering manager Violence: Not At Risk (12/17/2023)   Humiliation, Afraid, Rape, and Kick questionnaire    Fear of Current or Ex-Partner: No    Emotionally Abused: No    Physically Abused: No    Sexually Abused: No    Family History  Problem Relation Age of Onset   Heart disease Mother        Patient was [age] year old; died while pregnant.  Patient told it may have been a heart attack.   Heart Problems Sister    Heart attack Brother 40   Brain cancer Sister    Diabetes Brother       Review of systems complete and found to be negative unless listed above    PHYSICAL EXAM  General: Well developed, well nourished, in no acute distress HEENT:  Normocephalic and atramatic Neck:  No JVD.  Lungs: Clear bilaterally to auscultation and percussion. Heart: HRRR . Normal S1 and S2 without gallops or murmurs.  Abdomen: Bowel sounds are positive, abdomen soft and non-tender  Msk:  Back normal, normal gait. Normal strength and tone for age. Extremities: No clubbing, cyanosis or edema.   Neuro: Alert and oriented X 3. Psych:  Good affect, responds appropriately  Labs:   Lab Results  Component Value Date   WBC 9.3 12/17/2023   HGB 13.4 12/17/2023   HCT 38.4 (L) 12/17/2023   MCV 74.0 (L) 12/17/2023   PLT 235 12/17/2023    Recent Labs  Lab 12/17/23 0040 12/17/23 0537 12/18/23 0155  NA 134*   < > 138  K 3.9   < > 3.8  CL 104   < > 105  CO2 25   < > 24  BUN 18   < > 15  CREATININE 1.09   < > 1.14   CALCIUM 8.7*   < > 9.1  PROT 7.6  --   --   BILITOT 0.6  --   --   ALKPHOS 64  --   --   ALT 30  --   --   AST 30  --   --   GLUCOSE 168*   < > 106*   < > = values in this interval not displayed.   Lab Results  Component Value Date   TROPONINI <0.03 11/02/2017    Lab Results  Component Value Date   CHOL 161 12/17/2023   Lab Results  Component Value Date  HDL 32 (L) 12/17/2023   Lab Results  Component Value Date   LDLCALC 80 12/17/2023   Lab Results  Component Value Date   TRIG 245 (H) 12/17/2023   Lab Results  Component Value Date   CHOLHDL 5.0 12/17/2023   No results found for: "LDLDIRECT"    Radiology: ECHOCARDIOGRAM COMPLETE Result Date: 12/17/2023    ECHOCARDIOGRAM REPORT   Patient Name:   Jailen Ellett   Date of Exam: 12/17/2023 Medical Rec #:  [id number]  Height:       66.0 in Accession #:    [id number] Weight:       161.6 lb Date of Birth:  [birth year]  BSA:          1.827 m Patient Age:    67 years   BP:           131/74 mmHg Patient Gender: M          HR:           87 bpm. Exam Location:  [facility] Procedure: 2D Echo, Cardiac Doppler and Color Doppler (Both Spectral and Color            Flow Doppler were utilized during procedure). Indications:     Acute myocardial infarction - unspecified I21.9  History:         Patient has no prior history of Echocardiogram examinations.                  Risk Factors:Hypertension, Dyslipidemia and Diabetes.  Sonographer:     Cristela Blue Referring Phys:  [id number] ALEXANDER PARASCHOS Diagnosing Phys: Mellody Drown Alluri IMPRESSIONS  1. Left ventricular ejection fraction, by estimation, is 60 to 65%. The left ventricle has normal function. The left ventricle has no regional wall motion abnormalities. There is mild left ventricular hypertrophy. Left ventricular diastolic parameters are consistent with Grade I diastolic dysfunction (impaired relaxation).  2. Right ventricular systolic function is normal. The right ventricular size is normal.  3. The mitral  valve is normal in structure. Trivial mitral valve regurgitation.  4. The aortic valve is tricuspid. Aortic valve regurgitation is not visualized.  5. The inferior vena cava is normal in size with greater than 50% respiratory variability, suggesting right atrial pressure of 3 mmHg. FINDINGS  Left Ventricle: Left ventricular ejection fraction, by estimation, is 60 to 65%. The left ventricle has normal function. The left ventricle has no regional wall motion abnormalities. The left ventricular internal cavity size was normal in size. There is  mild left ventricular hypertrophy. Left ventricular diastolic parameters are consistent with Grade I diastolic dysfunction (impaired relaxation). Right Ventricle: The right ventricular size is normal. No increase in right ventricular wall thickness. Right ventricular systolic function is normal. Left Atrium: Left atrial size was normal in size. Right Atrium: Right atrial size was normal in size. Pericardium: There is no evidence of pericardial effusion. Mitral Valve: The mitral valve is normal in structure. Mild mitral annular calcification. Trivial mitral valve regurgitation. Tricuspid Valve: The tricuspid valve is normal in structure. Tricuspid valve regurgitation is trivial. Aortic Valve: The aortic valve is tricuspid. Aortic valve regurgitation is not visualized. Aortic valve mean gradient measures 2.0 mmHg. Aortic valve peak gradient measures 3.7 mmHg. Aortic valve area, by VTI measures 2.79 cm. Pulmonic Valve: The pulmonic valve was not well visualized. Pulmonic valve regurgitation is not visualized. Aorta: The aortic root is normal in size and structure. Venous: The inferior vena cava is normal in size with greater than 50% respiratory variability, suggesting  right atrial pressure of 3 mmHg. IAS/Shunts: The atrial septum is grossly normal.  LEFT VENTRICLE PLAX 2D LVIDd:         3.80 cm   Diastology LVIDs:         2.50 cm   LV e' medial:    4.90 cm/s LV PW:         1.30  cm   LV E/e' medial:  17.9 LV IVS:        1.20 cm   LV e' lateral:   10.60 cm/s LVOT diam:     2.00 cm   LV E/e' lateral: 8.3 LV SV:         50 LV SV Index:   28 LVOT Area:     3.14 cm  RIGHT VENTRICLE RV Basal diam:  3.00 cm RV Mid diam:    3.00 cm LEFT ATRIUM           Index        RIGHT ATRIUM           Index LA diam:      2.00 cm 1.09 cm/m   RA Area:     12.70 cm LA Vol (A2C): 28.0 ml 15.33 ml/m  RA Volume:   27.30 ml  14.95 ml/m LA Vol (A4C): 11.4 ml 6.24 ml/m  AORTIC VALVE AV Area (Vmax):    2.41 cm AV Area (Vmean):   2.54 cm AV Area (VTI):     2.79 cm AV Vmax:           96.60 cm/s AV Vmean:          67.500 cm/s AV VTI:            0.180 m AV Peak Grad:      3.7 mmHg AV Mean Grad:      2.0 mmHg LVOT Vmax:         74.20 cm/s LVOT Vmean:        54.600 cm/s LVOT VTI:          0.160 m LVOT/AV VTI ratio: 0.89  AORTA Ao Root diam: 3.00 cm MITRAL VALVE                TRICUSPID VALVE MV Area (PHT): 3.87 cm     TR Peak grad:   10.8 mmHg MV Decel Time: 196 msec     TR Vmax:        164.00 cm/s MV E velocity: 87.50 cm/s MV A velocity: 130.00 cm/s  SHUNTS MV E/A ratio:  0.67         Systemic VTI:  0.16 m                             Systemic Diam: 2.00 cm Windell Norfolk Electronically signed by Windell Norfolk Signature Date/Time: 12/17/2023/3:28:56 PM    Final    CARDIAC CATHETERIZATION Result Date: 12/17/2023   Ost Cx to Prox Cx lesion is 50% stenosed.   Mid Cx to Dist Cx lesion is 80% stenosed.   Prox Cx to Mid Cx lesion is 60% stenosed.   Mid LAD lesion is 80% stenosed.   Dist LAD lesion is 75% stenosed.   Mid RCA lesion is 99% stenosed.   Prox RCA lesion is 40% stenosed.   Dist RCA lesion is 50% stenosed.   A drug-eluting stent was successfully placed using a STENT ONYX FRONTIER 2.75X22.   Post intervention, there is a 0% residual stenosis.  The left ventricular systolic function is normal.   The left ventricular ejection fraction is 50-55% by visual estimate. 1.  Inferior STEMI 2.  Three-vessel coronary  artery disease with 80% stenosis mid LAD, 80% stenosis mid left circumflex, and 99% stenosis of mid RCA (culprit vessel) 3.  Near normal left ventricular function with estimate LVEF 50% with mild inferior apical hypokinesis 4.  Successful primary PCI with 2.75 x 22 mm Onyx Frontier DES mid RCA Recommendations 1.  Dual antiplatelet therapy uninterrupted x 1 year 2.  Start metoprolol succinate 25 mg daily 3.  Start atorvastatin 80 mg daily 4.  2D echocardiogram 5.  Staged PCI of mid LAD and mid left circumflex stenosis to be scheduled   EKG: Sinus rhythm with ST elevation in leads II, III and aVF, V4 V5 and V6  ASSESSMENT AND PLAN:   1.  Inferolateral STEMI, three-vessel coronary artery disease, with 80% stenosis LAD, 80% stenosis mid left circumflex, and 99% ulcerative stenosis mid RCA (culprit lesion), status post successful primary PCI with 2.75 x 22 mm Onyx frontier DES mid RCA 2.  Essential hypertension, on lisinopril 3.  Hyperlipidemia, on atorvastatin 4.  Type 2 diabetes, on metformin  Recommendations 1.  Dual antiplatelet therapy (aspirin and Brilinta) uninterrupted x 1 year 2.  Increase metoprolol tartrate to 50 mg twice daily 3.  Continue home lisinopril 20 mg daily 4.  Continue atorvastatin to 80 mg daily 5.  Echo with preserved EF, no wall motion abnormalities. 6.  Plan for staged PCI of mid LAD and possibly left circumflex on Monday with Dr. Darrold Junker.  This patient's plan of care was discussed and created with Dr. Corky Sing and he is in agreement.    SignedGale Journey, PA-C  12/18/2023, 9:54 AM

## 2023-12-18 NOTE — Plan of Care (Signed)
 Continuing with plan of care.

## 2023-12-19 DIAGNOSIS — E782 Mixed hyperlipidemia: Secondary | ICD-10-CM

## 2023-12-19 DIAGNOSIS — I2119 ST elevation (STEMI) myocardial infarction involving other coronary artery of inferior wall: Secondary | ICD-10-CM | POA: Diagnosis not present

## 2023-12-19 DIAGNOSIS — I25118 Atherosclerotic heart disease of native coronary artery with other forms of angina pectoris: Secondary | ICD-10-CM | POA: Diagnosis not present

## 2023-12-19 DIAGNOSIS — I10 Essential (primary) hypertension: Secondary | ICD-10-CM | POA: Diagnosis not present

## 2023-12-19 LAB — GLUCOSE, CAPILLARY
Glucose-Capillary: 147 mg/dL — ABNORMAL HIGH (ref 70–99)
Glucose-Capillary: 159 mg/dL — ABNORMAL HIGH (ref 70–99)
Glucose-Capillary: 116 mg/dL — ABNORMAL HIGH (ref 70–99)
Glucose-Capillary: 175 mg/dL — ABNORMAL HIGH (ref 70–99)

## 2023-12-19 LAB — CBC
HCT: 41.6 % (ref 39.0–52.0)
Hemoglobin: 14.2 g/dL (ref 13.0–17.0)
MCH: 25.1 pg — ABNORMAL LOW (ref 26.0–34.0)
MCHC: 34.1 g/dL (ref 30.0–36.0)
MCV: 73.5 fL — ABNORMAL LOW (ref 80.0–100.0)
Platelets: 261 10*3/uL (ref 150–400)
RBC: 5.66 MIL/uL (ref 4.22–5.81)
RDW: 12.6 % (ref 11.5–15.5)
WBC: 13.9 10*3/uL — ABNORMAL HIGH (ref 4.0–10.5)
nRBC: 0 % (ref 0.0–0.2)

## 2023-12-19 LAB — BASIC METABOLIC PANEL WITH GFR
Anion gap: 10 (ref 5–15)
BUN: 18 mg/dL (ref 8–23)
CO2: 23 mmol/L (ref 22–32)
Calcium: 9.3 mg/dL (ref 8.9–10.3)
Chloride: 101 mmol/L (ref 98–111)
Creatinine, Ser: 1.08 mg/dL (ref 0.61–1.24)
GFR, Estimated: >60 mL/min (ref >60)
Glucose, Bld: 145 mg/dL — ABNORMAL HIGH (ref 70–99)
Potassium: 3.9 mmol/L (ref 3.5–5.1)
Sodium: 134 mmol/L — ABNORMAL LOW (ref 135–145)

## 2023-12-19 LAB — MAGNESIUM: Magnesium: 2.4 mg/dL (ref 1.7–2.4)

## 2023-12-19 LAB — PHOSPHORUS: Phosphorus: 4.1 mg/dL (ref 2.5–4.6)

## 2023-12-19 MED ORDER — MELATONIN 5 MG PO TABS
2.5000 mg | ORAL_TABLET | Freq: Every day | ORAL | Status: DC
  Administered 2023-12-19 – 2023-12-21 (×3): 2.5 mg via ORAL
  Filled 2023-12-19 (×3): qty 1

## 2023-12-19 MED ORDER — GUAIFENESIN-DM 100-10 MG/5ML PO SYRP
15.0000 mL | ORAL_SOLUTION | ORAL | Status: DC | PRN
  Administered 2023-12-19: 15 mL via ORAL
  Filled 2023-12-19: qty 20

## 2023-12-19 NOTE — Plan of Care (Signed)
 Continuing with plan of care.

## 2023-12-19 NOTE — Progress Notes (Signed)
 Progress Note   Patient: Samuel Brady ZOX:[id number] DOB: [birth year] DOA: 12/17/2023     2 DOS: the patient was seen and examined on 12/19/2023   Brief hospital course: Jahmari Esbenshade is a 68 y.o. male with medical history significant of hypertension, hyperlipidemia, type 2 diabetes presenting with STEMI.  Patient reports waking up overnight with mild to severe central chest pain.  Chest pain with some radiation into the right arm.  Positive nausea and shortness of breath.  Denies any prior episodes like this in the past.  No reported tobacco or alcohol use.  Symptoms persisted at home.  Noted syncopal episode within the past month.  No focal hemiparesis or confusion.  EMS was subsequently called by patient's son.  Given full dose aspirin as well as nitroglycerin en route to the ER.  Patient does report significant family history of coronary disease. Presented to the ER afebrile, blood pressures 140s to 170s over 90s to 80s.  EKG with noted ST elevations in inferior and lateral leads.  White count 8.5, hemoglobin 13.3, platelets 224, creatinine 1.09.  Glucose 168.  Code STEMI called with patient was emergently sent to the Cath Lab.  Troponin [id number].  Coronary imaging showed Inferior STEMI, three-vessel coronary artery disease with 80% stenosis mid LAD, 80% stenosis mid left circumflex, and 99% stenosis of mid RCA (culprit vessel), near normal left ventricular function with estimate LVEF 50% with mild  inferior apical hypokinesis    Assessment and Plan: Inferior wall MI STEMI (ST elevation myocardial infarction) (HCC) Moderate to severe central chest pain that woke patient up from sleep last night EKG with noted inferolateral STEMI-code STEMI activated Coronary arteriography showing 80% stenosis mid LAD, 80% stenosis in mid to distal left circumflex, 99% ulcerative stenosis mid RCA s/p placement of DES to mid RCA  On aspirin and Brilinta x 1 year uninterrupted Continue statin therapy. Appreciate Cardiology  recommendations. Plan for staged PCI on Monday.    Type 2 diabetes mellitus Blood sugar 160s SSI A1c 7.5 Monitor   Hyperlipidemia High-dose Lipitor   Essential hypertension BP stable On ACE inhibitor, continue lisinopril 10 mg p.o. daily Metoprolol 50 bid added. Continue to monitor BP and titrate medications accordingly.     Out of bed to chair. Incentive spirometry. Nursing supportive care. Fall, aspiration precautions. Diet:  Diet Orders (From admission, onward)     Start     Ordered   12/17/23 0234  Diet heart healthy/carb modified Room service appropriate? Yes; Fluid consistency: Thin  Diet effective now       Question Answer Comment  Diet-HS Snack? Nothing   Room service appropriate? Yes   Fluid consistency: Thin      12/17/23 0235           DVT prophylaxis: enoxaparin (LOVENOX) injection 40 mg Start: 12/17/23 1000  Level of care: Step down.   Code Status: Full Code  Subjective: Patient is seen and examined today morning. He is lying in bed. Has no chest pain. Sleeping less, asks for sleeping aid. No overnight issues.  Physical Exam: Vitals:   12/19/23 0900 12/19/23 1000 12/19/23 1100 12/19/23 1200  BP:      Pulse: 89 83 81 81  Resp: (!) 25 15 17  (!) 24  Temp:      TempSrc:      SpO2: 95% 94% 95% 95%  Weight:      Height:        General - Elderly Asian male, no apparent distress HEENT - PERRLA, EOMI,  atraumatic head, non tender sinuses. Lung - Clear, basal rales, no rhonchi, wheezes. Heart - S1, S2 heard, no murmurs, rubs, trace pedal edema. Abdomen - Soft, non tender, obese, bowel sounds good Neuro - Alert, awake and oriented x 3, non focal exam. Skin - Warm and dry.  Data Reviewed:      Latest Ref Rng & Units 12/19/2023    5:56 AM 12/17/2023    5:37 AM 12/17/2023   12:40 AM  CBC  WBC 4.0 - 10.5 K/uL 13.9  9.3  8.5   Hemoglobin 13.0 - 17.0 g/dL 16.1  09.6  04.5   Hematocrit 39.0 - 52.0 % 41.6  38.4  39.2   Platelets 150 - 400 K/uL 261   235  224       Latest Ref Rng & Units 12/19/2023    5:56 AM 12/18/2023    1:55 AM 12/17/2023    5:37 AM  BMP  Glucose 70 - 99 mg/dL 409  811  914   BUN 8 - 23 mg/dL 18  15  17    Creatinine 0.61 - 1.24 mg/dL 7.82  9.56  2.13   Sodium 135 - 145 mmol/L 134  138  135   Potassium 3.5 - 5.1 mmol/L 3.9  3.8  4.2   Chloride 98 - 111 mmol/L 101  105  104   CO2 22 - 32 mmol/L 23  24  23    Calcium 8.9 - 10.3 mg/dL 9.3  9.1  9.0    ECHOCARDIOGRAM COMPLETE Result Date: 12/17/2023    ECHOCARDIOGRAM REPORT   Patient Name:   Samuel Brady   Date of Exam: 12/17/2023 Medical Rec #:  [id number]  Height:       66.0 in Accession #:    [id number] Weight:       161.6 lb Date of Birth:  1956-08-10  BSA:          1.827 m Patient Age:    67 years   BP:           131/74 mmHg Patient Gender: M          HR:           87 bpm. Exam Location:  [facility] Procedure: 2D Echo, Cardiac Doppler and Color Doppler (Both Spectral and Color            Flow Doppler were utilized during procedure). Indications:     Acute myocardial infarction - unspecified I21.9  History:         Patient has no prior history of Echocardiogram examinations.                  Risk Factors:Hypertension, Dyslipidemia and Diabetes.  Sonographer:     Cristela Blue Referring Phys:  [id number] ALEXANDER PARASCHOS Diagnosing Phys: Mellody Drown Alluri IMPRESSIONS  1. Left ventricular ejection fraction, by estimation, is 60 to 65%. The left ventricle has normal function. The left ventricle has no regional wall motion abnormalities. There is mild left ventricular hypertrophy. Left ventricular diastolic parameters are consistent with Grade I diastolic dysfunction (impaired relaxation).  2. Right ventricular systolic function is normal. The right ventricular size is normal.  3. The mitral valve is normal in structure. Trivial mitral valve regurgitation.  4. The aortic valve is tricuspid. Aortic valve regurgitation is not visualized.  5. The inferior vena cava is normal in size with greater than 50%  respiratory variability, suggesting right atrial pressure of 3 mmHg. FINDINGS  Left Ventricle: Left ventricular ejection fraction, by estimation,  is 60 to 65%. The left ventricle has normal function. The left ventricle has no regional wall motion abnormalities. The left ventricular internal cavity size was normal in size. There is  mild left ventricular hypertrophy. Left ventricular diastolic parameters are consistent with Grade I diastolic dysfunction (impaired relaxation). Right Ventricle: The right ventricular size is normal. No increase in right ventricular wall thickness. Right ventricular systolic function is normal. Left Atrium: Left atrial size was normal in size. Right Atrium: Right atrial size was normal in size. Pericardium: There is no evidence of pericardial effusion. Mitral Valve: The mitral valve is normal in structure. Mild mitral annular calcification. Trivial mitral valve regurgitation. Tricuspid Valve: The tricuspid valve is normal in structure. Tricuspid valve regurgitation is trivial. Aortic Valve: The aortic valve is tricuspid. Aortic valve regurgitation is not visualized. Aortic valve mean gradient measures 2.0 mmHg. Aortic valve peak gradient measures 3.7 mmHg. Aortic valve area, by VTI measures 2.79 cm. Pulmonic Valve: The pulmonic valve was not well visualized. Pulmonic valve regurgitation is not visualized. Aorta: The aortic root is normal in size and structure. Venous: The inferior vena cava is normal in size with greater than 50% respiratory variability, suggesting right atrial pressure of 3 mmHg. IAS/Shunts: The atrial septum is grossly normal.  LEFT VENTRICLE PLAX 2D LVIDd:         3.80 cm   Diastology LVIDs:         2.50 cm   LV e' medial:    4.90 cm/s LV PW:         1.30 cm   LV E/e' medial:  17.9 LV IVS:        1.20 cm   LV e' lateral:   10.60 cm/s LVOT diam:     2.00 cm   LV E/e' lateral: 8.3 LV SV:         50 LV SV Index:   28 LVOT Area:     3.14 cm  RIGHT VENTRICLE RV Basal  diam:  3.00 cm RV Mid diam:    3.00 cm LEFT ATRIUM           Index        RIGHT ATRIUM           Index LA diam:      2.00 cm 1.09 cm/m   RA Area:     12.70 cm LA Vol (A2C): 28.0 ml 15.33 ml/m  RA Volume:   27.30 ml  14.95 ml/m LA Vol (A4C): 11.4 ml 6.24 ml/m  AORTIC VALVE AV Area (Vmax):    2.41 cm AV Area (Vmean):   2.54 cm AV Area (VTI):     2.79 cm AV Vmax:           96.60 cm/s AV Vmean:          67.500 cm/s AV VTI:            0.180 m AV Peak Grad:      3.7 mmHg AV Mean Grad:      2.0 mmHg LVOT Vmax:         74.20 cm/s LVOT Vmean:        54.600 cm/s LVOT VTI:          0.160 m LVOT/AV VTI ratio: 0.89  AORTA Ao Root diam: 3.00 cm MITRAL VALVE                TRICUSPID VALVE MV Area (PHT): 3.87 cm     TR Peak grad:   10.8  mmHg MV Decel Time: 196 msec     TR Vmax:        164.00 cm/s MV E velocity: 87.50 cm/s MV A velocity: 130.00 cm/s  SHUNTS MV E/A ratio:  0.67         Systemic VTI:  0.16 m                             Systemic Diam: 2.00 cm Windell Norfolk Electronically signed by Windell Norfolk Signature Date/Time: 12/17/2023/3:28:56 PM    Final     Family Communication: Discussed with patient, he understand and agree. All questions answered.  Disposition: Status is: Inpatient Remains inpatient appropriate because: STEMI s/p RCA stent, still has significant CAD of LAD/LCx, needs staged PCI on Monday   Planned Discharge Destination: Home     Time spent: 41 minutes  Author: Marcelino Duster, MD 12/19/2023 12:27 PM Secure chat 7am to 7pm For on call review [email or web address].

## 2023-12-19 NOTE — Progress Notes (Signed)
 Patient ID: Samuel Brady, male   DOB: 06/24/1956, 68 y.o.   MRN: [id number] [facility]    SUBJECTIVE: Patient resting comfortably in bed no chest pain concerned about PCI for Monday right wrist appears to be healing well good appetite out of bed to chair feels reasonably well   Vitals:   12/19/23 1100 12/19/23 1200 12/19/23 1227 12/19/23 1300  BP:   105/70   Pulse: 81 81 84 84  Resp: 17 (!) 24 13 18   Temp:   98.2 F (36.8 C)   TempSrc:   Oral   SpO2: 95% 95% 95% 94%  Weight:      Height:         Intake/Output Summary (Last 24 hours) at 12/19/2023 1501 Last data filed at 12/19/2023 0800 Gross per 24 hour  Intake --  Output 375 ml  Net -375 ml      PHYSICAL EXAM  General: Well developed, well nourished, in no acute distress HEENT:  Normocephalic and atramatic Neck:  No JVD.  Lungs: Clear bilaterally to auscultation and percussion. Heart: HRRR . Normal S1 and S2 without gallops or murmurs.  Abdomen: Bowel sounds are positive, abdomen soft and non-tender  Msk:  Back normal, normal gait. Normal strength and tone for age. Extremities: No clubbing, cyanosis or edema.   Neuro: Alert and oriented X 3. Psych:  Good affect, responds appropriately   LABS: Basic Metabolic Panel: Recent Labs    12/17/23 0040 12/17/23 0537 12/18/23 0155 12/19/23 0556  NA 134*   < > 138 134*  K 3.9   < > 3.8 3.9  CL 104   < > 105 101  CO2 25   < > 24 23  GLUCOSE 168*   < > 106* 145*  BUN 18   < > 15 18  CREATININE 1.09   < > 1.14 1.08  CALCIUM 8.7*   < > 9.1 9.3  MG 2.1  --   --  2.4  PHOS  --   --   --  4.1   < > = values in this interval not displayed.   Liver Function Tests: Recent Labs    12/17/23 0040  AST 30  ALT 30  ALKPHOS 64  BILITOT 0.6  PROT 7.6  ALBUMIN 4.0   No results for input(s): "LIPASE", "AMYLASE" in the last 72 hours. CBC: Recent Labs    12/17/23 0040 12/17/23 0537 12/19/23 0556  WBC 8.5 9.3 13.9*  NEUTROABS 4.2  --   --   HGB 13.3 13.4 14.2  HCT  39.2 38.4* 41.6  MCV 75.4* 74.0* 73.5*  PLT 224 235 261   Cardiac Enzymes: No results for input(s): "CKTOTAL", "CKMB", "CKMBINDEX", "TROPONINI" in the last 72 hours. BNP: Invalid input(s): "POCBNP" D-Dimer: No results for input(s): "DDIMER" in the last 72 hours. Hemoglobin A1C: Recent Labs    12/17/23 0313  HGBA1C 7.5*   Fasting Lipid Panel: Recent Labs    12/17/23 0040  CHOL 161  HDL 32*  LDLCALC 80  TRIG 409*  CHOLHDL 5.0   Thyroid Function Tests: No results for input(s): "TSH", "T4TOTAL", "T3FREE", "THYROIDAB" in the last 72 hours.  Invalid input(s): "FREET3" Anemia Panel: No results for input(s): "VITAMINB12", "FOLATE", "FERRITIN", "TIBC", "IRON", "RETICCTPCT" in the last 72 hours.  No results found.   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

## 2023-12-19 NOTE — Plan of Care (Signed)
  Problem: Education: Goal: Knowledge of General Education information will improve Description: Including pain rating scale, medication(s)/side effects and non-pharmacologic comfort measures Outcome: Progressing   Problem: Health Behavior/Discharge Planning: Goal: Ability to manage health-related needs will improve Outcome: Progressing   Problem: Clinical Measurements: Goal: Ability to maintain clinical measurements within normal limits will improve Outcome: Progressing Goal: Will remain free from infection Outcome: Progressing Goal: Diagnostic test results will improve Outcome: Progressing Goal: Respiratory complications will improve Outcome: Progressing Goal: Cardiovascular complication will be avoided Outcome: Progressing   Problem: Activity: Goal: Risk for activity intolerance will decrease Outcome: Progressing   Problem: Nutrition: Goal: Adequate nutrition will be maintained Outcome: Progressing   Problem: Coping: Goal: Level of anxiety will decrease Outcome: Progressing   Problem: Elimination: Goal: Will not experience complications related to bowel motility Outcome: Progressing Goal: Will not experience complications related to urinary retention Outcome: Progressing   Problem: Pain Managment: Goal: General experience of comfort will improve and/or be controlled Outcome: Progressing   Problem: Safety: Goal: Ability to remain free from injury will improve Outcome: Progressing   Problem: Skin Integrity: Goal: Risk for impaired skin integrity will decrease Outcome: Progressing   Problem: Education: Goal: Understanding of CV disease, CV risk reduction, and recovery process will improve Outcome: Progressing Goal: Individualized Educational Video(s) Outcome: Progressing   Problem: Activity: Goal: Ability to return to baseline activity level will improve Outcome: Progressing   Problem: Cardiovascular: Goal: Ability to achieve and maintain adequate  cardiovascular perfusion will improve Outcome: Progressing Goal: Vascular access site(s) Level 0-1 will be maintained Outcome: Progressing   Problem: Health Behavior/Discharge Planning: Goal: Ability to safely manage health-related needs after discharge will improve Outcome: Progressing   Problem: Education: Goal: Ability to describe self-care measures that may prevent or decrease complications (Diabetes Survival Skills Education) will improve Outcome: Progressing Goal: Individualized Educational Video(s) Outcome: Progressing   Problem: Coping: Goal: Ability to adjust to condition or change in health will improve Outcome: Progressing   Problem: Fluid Volume: Goal: Ability to maintain a balanced intake and output will improve Outcome: Progressing   Problem: Health Behavior/Discharge Planning: Goal: Ability to identify and utilize available resources and services will improve Outcome: Progressing Goal: Ability to manage health-related needs will improve Outcome: Progressing   Problem: Metabolic: Goal: Ability to maintain appropriate glucose levels will improve Outcome: Progressing   Problem: Nutritional: Goal: Maintenance of adequate nutrition will improve Outcome: Progressing Goal: Progress toward achieving an optimal weight will improve Outcome: Progressing   Problem: Skin Integrity: Goal: Risk for impaired skin integrity will decrease Outcome: Progressing   Problem: Tissue Perfusion: Goal: Adequacy of tissue perfusion will improve Outcome: Progressing   Problem: Education: Goal: Understanding of cardiac disease, CV risk reduction, and recovery process will improve Outcome: Progressing Goal: Individualized Educational Video(s) Outcome: Progressing   Problem: Activity: Goal: Ability to tolerate increased activity will improve Outcome: Progressing   Problem: Cardiac: Goal: Ability to achieve and maintain adequate cardiovascular perfusion will improve Outcome:  Progressing   Problem: Health Behavior/Discharge Planning: Goal: Ability to safely manage health-related needs after discharge will improve Outcome: Progressing

## 2023-12-20 ENCOUNTER — Other Ambulatory Visit: Payer: Self-pay

## 2023-12-20 DIAGNOSIS — I10 Essential (primary) hypertension: Secondary | ICD-10-CM | POA: Diagnosis not present

## 2023-12-20 DIAGNOSIS — I2119 ST elevation (STEMI) myocardial infarction involving other coronary artery of inferior wall: Secondary | ICD-10-CM | POA: Diagnosis not present

## 2023-12-20 DIAGNOSIS — E782 Mixed hyperlipidemia: Secondary | ICD-10-CM | POA: Diagnosis not present

## 2023-12-20 DIAGNOSIS — I25118 Atherosclerotic heart disease of native coronary artery with other forms of angina pectoris: Secondary | ICD-10-CM | POA: Diagnosis not present

## 2023-12-20 LAB — CBC
HCT: 40.9 % (ref 39.0–52.0)
Hemoglobin: 14 g/dL (ref 13.0–17.0)
MCH: 25 pg — ABNORMAL LOW (ref 26.0–34.0)
MCHC: 34.2 g/dL (ref 30.0–36.0)
MCV: 73.2 fL — ABNORMAL LOW (ref 80.0–100.0)
Platelets: 275 10*3/uL (ref 150–400)
RBC: 5.59 MIL/uL (ref 4.22–5.81)
RDW: 12.5 % (ref 11.5–15.5)
WBC: 14 10*3/uL — ABNORMAL HIGH (ref 4.0–10.5)
nRBC: 0 % (ref 0.0–0.2)

## 2023-12-20 LAB — BASIC METABOLIC PANEL WITH GFR
Anion gap: 13 (ref 5–15)
BUN: 21 mg/dL (ref 8–23)
CO2: 21 mmol/L — ABNORMAL LOW (ref 22–32)
Calcium: 9.3 mg/dL (ref 8.9–10.3)
Chloride: 100 mmol/L (ref 98–111)
Creatinine, Ser: 1.19 mg/dL (ref 0.61–1.24)
GFR, Estimated: >60 mL/min (ref >60)
Glucose, Bld: 155 mg/dL — ABNORMAL HIGH (ref 70–99)
Potassium: 4 mmol/L (ref 3.5–5.1)
Sodium: 134 mmol/L — ABNORMAL LOW (ref 135–145)

## 2023-12-20 LAB — GLUCOSE, CAPILLARY
Glucose-Capillary: 147 mg/dL — ABNORMAL HIGH (ref 70–99)
Glucose-Capillary: 254 mg/dL — ABNORMAL HIGH (ref 70–99)
Glucose-Capillary: 144 mg/dL — ABNORMAL HIGH (ref 70–99)
Glucose-Capillary: 122 mg/dL — ABNORMAL HIGH (ref 70–99)

## 2023-12-20 MED ORDER — SODIUM CHLORIDE 0.9% FLUSH
3.0000 mL | Freq: Two times a day (BID) | INTRAVENOUS | Status: DC
  Administered 2023-12-20: 3 mL via INTRAVENOUS

## 2023-12-20 MED ORDER — SODIUM CHLORIDE 0.9 % IV SOLN: 250.0000 mL | INTRAVENOUS | Status: DC | PRN

## 2023-12-20 MED ORDER — SODIUM CHLORIDE 0.9 % WEIGHT BASED INFUSION
1.0000 mL/kg/h | INTRAVENOUS | Status: DC
  Administered 2023-12-21: 1 mL/kg/h via INTRAVENOUS

## 2023-12-20 MED ORDER — SODIUM CHLORIDE 0.9 % WEIGHT BASED INFUSION: 3.0000 mL/kg/h | INTRAVENOUS | Status: DC

## 2023-12-20 MED ORDER — ASPIRIN 81 MG PO CHEW
81.0000 mg | CHEWABLE_TABLET | ORAL | Status: AC
  Administered 2023-12-21: 81 mg via ORAL
  Filled 2023-12-20: qty 1

## 2023-12-20 MED ORDER — SODIUM CHLORIDE 0.9% FLUSH
3.0000 mL | INTRAVENOUS | Status: DC | PRN
  Administered 2023-12-20: 3 mL via INTRAVENOUS

## 2023-12-20 MED ORDER — SODIUM CHLORIDE 0.9 % WEIGHT BASED INFUSION: 1.0000 mL/kg/h | INTRAVENOUS | Status: DC

## 2023-12-20 MED ORDER — SODIUM CHLORIDE 0.9 % WEIGHT BASED INFUSION
3.0000 mL/kg/h | INTRAVENOUS | Status: AC
  Administered 2023-12-21: 3 mL/kg/h via INTRAVENOUS

## 2023-12-20 NOTE — Plan of Care (Signed)
 Continuing with plan of care.

## 2023-12-20 NOTE — Plan of Care (Signed)
  Problem: Education: Goal: Knowledge of General Education information will improve Description: Including pain rating scale, medication(s)/side effects and non-pharmacologic comfort measures Outcome: Progressing   Problem: Health Behavior/Discharge Planning: Goal: Ability to manage health-related needs will improve Outcome: Progressing   Problem: Clinical Measurements: Goal: Ability to maintain clinical measurements within normal limits will improve Outcome: Progressing Goal: Will remain free from infection Outcome: Progressing Goal: Diagnostic test results will improve Outcome: Progressing Goal: Respiratory complications will improve Outcome: Progressing Goal: Cardiovascular complication will be avoided Outcome: Progressing   Problem: Activity: Goal: Risk for activity intolerance will decrease Outcome: Progressing   Problem: Nutrition: Goal: Adequate nutrition will be maintained Outcome: Progressing   Problem: Coping: Goal: Level of anxiety will decrease Outcome: Progressing   Problem: Elimination: Goal: Will not experience complications related to bowel motility Outcome: Progressing Goal: Will not experience complications related to urinary retention Outcome: Progressing   Problem: Pain Managment: Goal: General experience of comfort will improve and/or be controlled Outcome: Progressing   Problem: Safety: Goal: Ability to remain free from injury will improve Outcome: Progressing   Problem: Skin Integrity: Goal: Risk for impaired skin integrity will decrease Outcome: Progressing   Problem: Education: Goal: Understanding of CV disease, CV risk reduction, and recovery process will improve Outcome: Progressing Goal: Individualized Educational Video(s) Outcome: Progressing   Problem: Activity: Goal: Ability to return to baseline activity level will improve Outcome: Progressing   Problem: Cardiovascular: Goal: Ability to achieve and maintain adequate  cardiovascular perfusion will improve Outcome: Progressing Goal: Vascular access site(s) Level 0-1 will be maintained Outcome: Progressing   Problem: Health Behavior/Discharge Planning: Goal: Ability to safely manage health-related needs after discharge will improve Outcome: Progressing   Problem: Education: Goal: Ability to describe self-care measures that may prevent or decrease complications (Diabetes Survival Skills Education) will improve Outcome: Progressing Goal: Individualized Educational Video(s) Outcome: Progressing   Problem: Coping: Goal: Ability to adjust to condition or change in health will improve Outcome: Progressing   Problem: Fluid Volume: Goal: Ability to maintain a balanced intake and output will improve Outcome: Progressing   Problem: Health Behavior/Discharge Planning: Goal: Ability to identify and utilize available resources and services will improve Outcome: Progressing Goal: Ability to manage health-related needs will improve Outcome: Progressing   Problem: Metabolic: Goal: Ability to maintain appropriate glucose levels will improve Outcome: Progressing   Problem: Nutritional: Goal: Maintenance of adequate nutrition will improve Outcome: Progressing Goal: Progress toward achieving an optimal weight will improve Outcome: Progressing   Problem: Skin Integrity: Goal: Risk for impaired skin integrity will decrease Outcome: Progressing   Problem: Tissue Perfusion: Goal: Adequacy of tissue perfusion will improve Outcome: Progressing   Problem: Education: Goal: Understanding of cardiac disease, CV risk reduction, and recovery process will improve Outcome: Progressing Goal: Individualized Educational Video(s) Outcome: Progressing   Problem: Activity: Goal: Ability to tolerate increased activity will improve Outcome: Progressing   Problem: Cardiac: Goal: Ability to achieve and maintain adequate cardiovascular perfusion will improve Outcome:  Progressing   Problem: Health Behavior/Discharge Planning: Goal: Ability to safely manage health-related needs after discharge will improve Outcome: Progressing

## 2023-12-20 NOTE — Progress Notes (Signed)
 Progress Note   Patient: Samuel Brady WUJ:[id number] DOB: March 17, 1956 DOA: 12/17/2023     3 DOS: the patient was seen and examined on 12/20/2023   Brief hospital course: Haruto Demaria is a 68 y.o. male with medical history significant of hypertension, hyperlipidemia, type 2 diabetes presenting with STEMI.  Patient reports waking up overnight with mild to severe central chest pain.  Chest pain with some radiation into the right arm.  Positive nausea and shortness of breath.  Denies any prior episodes like this in the past.  No reported tobacco or alcohol use.  Symptoms persisted at home.  Noted syncopal episode within the past month.  No focal hemiparesis or confusion.  EMS was subsequently called by patient's son.  Given full dose aspirin as well as nitroglycerin en route to the ER.  Patient does report significant family history of coronary disease. Presented to the ER afebrile, blood pressures 140s to 170s over 90s to 80s.  EKG with noted ST elevations in inferior and lateral leads.  White count 8.5, hemoglobin 13.3, platelets 224, creatinine 1.09.  Glucose 168.  Code STEMI called with patient was emergently sent to the Cath Lab.  Troponin 285-316.  Cardiac cath showed Inferior STEMI, three-vessel coronary artery disease with 80% stenosis mid LAD, 80% stenosis mid left circumflex, and 99% stenosis of mid RCA (culprit vessel), near normal left ventricular function with estimate LVEF 50% with mild  inferior apical hypokinesis. Patient underwent primary PCI receiving 2.75 x 22 mm Onyx Frontier stent in the mid RCA. Cardiology plan for staged PCI on Monday.  Assessment and Plan: Inferior wall MI STEMI (ST elevation myocardial infarction) (HCC) Moderate to severe central chest pain that woke patient up from sleep last night. EKG with noted inferolateral STEMI-code STEMI activated Coronary arteriography showing 80% stenosis mid LAD, 80% stenosis in mid to distal left circumflex, 99% ulcerative stenosis mid RCA s/p  placement of DES to mid RCA. On aspirin and Brilinta x 1 year uninterrupted Continue statin therapy. Appreciate Cardiology recommendations. Plan for staged PCI on Monday.    Type 2 diabetes mellitus A1c 7.5, Blood sugar 160s Continue accucheks, sliding scale as per protocol.   Hyperlipidemia Continue Lipitor 80mg  daily.   Essential hypertension BP stable.  On ACE inhibitor, continue lisinopril 10 mg p.o. daily Metoprolol 50 bid added. Continue to monitor BP and titrate medications accordingly.     Antitussive medications for his cough. Out of bed to chair. Incentive spirometry. Nursing supportive care. Fall, aspiration precautions. Diet:  Diet Orders (From admission, onward)     Start     Ordered   12/17/23 0234  Diet heart healthy/carb modified Room service appropriate? Yes; Fluid consistency: Thin  Diet effective now       Question Answer Comment  Diet-HS Snack? Nothing   Room service appropriate? Yes   Fluid consistency: Thin      12/17/23 0235           DVT prophylaxis: enoxaparin (LOVENOX) injection 40 mg Start: 12/17/23 1000  Level of care: Step down.   Code Status: Full Code  Subjective: Patient is seen and examined today morning. He is sitting in chair. Reports cough. Had good night sleep. No chest pain. Family at bedside.  Physical Exam: Vitals:   12/20/23 1100 12/20/23 1200 12/20/23 1214 12/20/23 1300  BP:   120/74   Pulse: 73 84 75 84  Resp: 20 14 14 20   Temp:   (!) 97.4 F (36.3 C)   TempSrc:   Oral  SpO2: 97% 99% 98% 100%  Weight:      Height:        General - Elderly Asian male, sitting in no apparent distress HEENT - PERRLA, EOMI, atraumatic head, non tender sinuses. Lung - Clear, basal rales, no rhonchi, wheezes. Heart - S1, S2 heard, no murmurs, rubs, trace pedal edema. Abdomen - Soft, non tender, obese, bowel sounds good Neuro - Alert, awake and oriented x 3, non focal exam. Skin - Warm and dry.  Data Reviewed:      Latest  Ref Rng & Units 12/20/2023    2:54 AM 12/19/2023    5:56 AM 12/17/2023    5:37 AM  CBC  WBC 4.0 - 10.5 K/uL 14.0  13.9  9.3   Hemoglobin 13.0 - 17.0 g/dL 16.1  09.6  04.5   Hematocrit 39.0 - 52.0 % 40.9  41.6  38.4   Platelets 150 - 400 K/uL 275  261  235       Latest Ref Rng & Units 12/20/2023    2:54 AM 12/19/2023    5:56 AM 12/18/2023    1:55 AM  BMP  Glucose 70 - 99 mg/dL 409  811  914   BUN 8 - 23 mg/dL 21  18  15    Creatinine 0.61 - 1.24 mg/dL 7.82  9.56  2.13   Sodium 135 - 145 mmol/L 134  134  138   Potassium 3.5 - 5.1 mmol/L 4.0  3.9  3.8   Chloride 98 - 111 mmol/L 100  101  105   CO2 22 - 32 mmol/L 21  23  24    Calcium 8.9 - 10.3 mg/dL 9.3  9.3  9.1    No results found.   Family Communication: Discussed with patient, family. They understand and agree. All questions answered.  Disposition: Status is: Inpatient Remains inpatient appropriate because: staged PCI on Monday   Planned Discharge Destination: Home     Time spent: 40 minutes  Author: Marcelino Duster, MD 12/20/2023 1:32 PM Secure chat 7am to 7pm For on call review [email or web address].

## 2023-12-20 NOTE — Progress Notes (Signed)
 Patient ID: Samuel Brady, male   DOB: [birth year], 68 y.o.   MRN: [id number] [facility]    SUBJECTIVE: Resting comfortably in bed ambulating in his room no chest pain no shortness of breath.  Resting comfortably right radial access stable minimal tenderness   Vitals:   12/20/23 0900 12/20/23 0903 12/20/23 1000 12/20/23 1100  BP:  107/75    Pulse: 89 89 78 73  Resp: 18 16 17 20   Temp:  98.4 F (36.9 C)    TempSrc:  Oral    SpO2: 93% 95% 97% 97%  Weight:      Height:         Intake/Output Summary (Last 24 hours) at 12/20/2023 1136 Last data filed at 12/19/2023 2130 Gross per 24 hour  Intake 6 ml  Output --  Net 6 ml      PHYSICAL EXAM  General: Well developed, well nourished, in no acute distress HEENT:  Normocephalic and atramatic Neck:  No JVD.  Lungs: Clear bilaterally to auscultation and percussion. Heart: HRRR . Normal S1 and S2 without gallops or murmurs.  Abdomen: Bowel sounds are positive, abdomen soft and non-tender  Msk:  Back normal, normal gait. Normal strength and tone for age. Extremities: No clubbing, cyanosis or edema.   Neuro: Alert and oriented X 3. Psych:  Good affect, responds appropriately   LABS: Basic Metabolic Panel: Recent Labs    12/19/23 0556 12/20/23 0254  NA 134* 134*  K 3.9 4.0  CL 101 100  CO2 23 21*  GLUCOSE 145* 155*  BUN 18 21  CREATININE 1.08 1.19  CALCIUM 9.3 9.3  MG 2.4  --   PHOS 4.1  --    Liver Function Tests: No results for input(s): "AST", "ALT", "ALKPHOS", "BILITOT", "PROT", "ALBUMIN" in the last 72 hours. No results for input(s): "LIPASE", "AMYLASE" in the last 72 hours. CBC: Recent Labs    12/19/23 0556 12/20/23 0254  WBC 13.9* 14.0*  HGB 14.2 14.0  HCT 41.6 40.9  MCV 73.5* 73.2*  PLT 261 275   Cardiac Enzymes: No results for input(s): "CKTOTAL", "CKMB", "CKMBINDEX", "TROPONINI" in the last 72 hours. BNP: Invalid input(s): "POCBNP" D-Dimer: No results for input(s): "DDIMER" in the last 72  hours. Hemoglobin A1C: No results for input(s): "HGBA1C" in the last 72 hours. Fasting Lipid Panel: No results for input(s): "CHOL", "HDL", "LDLCALC", "TRIG", "CHOLHDL", "LDLDIRECT" in the last 72 hours. Thyroid Function Tests: No results for input(s): "TSH", "T4TOTAL", "T3FREE", "THYROIDAB" in the last 72 hours.  Invalid input(s): "FREET3" Anemia Panel: No results for input(s): "VITAMINB12", "FOLATE", "FERRITIN", "TIBC", "IRON", "RETICCTPCT" in the last 72 hours.  No results found.   Echo preserved left ventricular function  TELEMETRY: Sinus rhythm 75 nonspecific ST-T wave changes:  ASSESSMENT AND PLAN:  Principal Problem:   STEMI involving oth coronary artery of inferior wall (HCC) Active Problems:   Essential hypertension   STEMI (ST elevation myocardial infarction) (HCC)   Hyperlipidemia   Type 2 diabetes mellitus (HCC)    Plan Preop for staged PCI of LAD possibly circumflex on Monday.  Patient on upstream Brilinta aspirin Status post STEMI with PCI and stent to mid RCA with DES successfully on aspirin Brilinta statin beta-blocker ACE inhibitor Hyperlipidemia continue Lipitor therapy for lipid management Hypertension controlled with beta-blocker ACE inhibitor Diabetes continue diabetes management and control Multivessel coronary disease status post STEMI now undergoing staged PCI prior to discharge of nonculprit vessels    Alwyn Pea, MD 12/20/2023 11:36 AM

## 2023-12-21 ENCOUNTER — Other Ambulatory Visit: Payer: Self-pay

## 2023-12-21 ENCOUNTER — Encounter
Admission: EM | Disposition: A | Discharge status: Home / Self Care | Payer: Self-pay | Source: Home / Self Care | Attending: Student

## 2023-12-21 DIAGNOSIS — I2119 ST elevation (STEMI) myocardial infarction involving other coronary artery of inferior wall: Secondary | ICD-10-CM | POA: Diagnosis not present

## 2023-12-21 HISTORY — PX: CORONARY STENT INTERVENTION: CATH118234

## 2023-12-21 LAB — CBC
HCT: 40.9 % (ref 39.0–52.0)
Hemoglobin: 14 g/dL (ref 13.0–17.0)
MCH: 25 pg — ABNORMAL LOW (ref 26.0–34.0)
MCHC: 34.2 g/dL (ref 30.0–36.0)
MCV: 72.9 fL — ABNORMAL LOW (ref 80.0–100.0)
Platelets: 262 10*3/uL (ref 150–400)
RBC: 5.61 MIL/uL (ref 4.22–5.81)
RDW: 12.5 % (ref 11.5–15.5)
WBC: 12 10*3/uL — ABNORMAL HIGH (ref 4.0–10.5)
nRBC: 0 % (ref 0.0–0.2)

## 2023-12-21 LAB — BASIC METABOLIC PANEL WITH GFR
Anion gap: 12 (ref 5–15)
BUN: 19 mg/dL (ref 8–23)
CO2: 22 mmol/L (ref 22–32)
Calcium: 9.4 mg/dL (ref 8.9–10.3)
Chloride: 104 mmol/L (ref 98–111)
Creatinine, Ser: 1.22 mg/dL (ref 0.61–1.24)
GFR, Estimated: >60 mL/min (ref >60)
Glucose, Bld: 119 mg/dL — ABNORMAL HIGH (ref 70–99)
Potassium: 4.3 mmol/L (ref 3.5–5.1)
Sodium: 138 mmol/L (ref 135–145)

## 2023-12-21 LAB — CG4 I-STAT (LACTIC ACID): Lactic Acid, Venous: 1.1 mmol/L (ref 0.5–1.9)

## 2023-12-21 LAB — GLUCOSE, CAPILLARY
Glucose-Capillary: 138 mg/dL — ABNORMAL HIGH (ref 70–99)
Glucose-Capillary: 134 mg/dL — ABNORMAL HIGH (ref 70–99)
Glucose-Capillary: 136 mg/dL — ABNORMAL HIGH (ref 70–99)
Glucose-Capillary: 146 mg/dL — ABNORMAL HIGH (ref 70–99)

## 2023-12-21 LAB — POCT ACTIVATED CLOTTING TIME
Activated Clotting Time: 273 s
Activated Clotting Time: 279 s
Activated Clotting Time: 394 s
Activated Clotting Time: 481 s
Activated Clotting Time: 993 s

## 2023-12-21 LAB — HEMOGLOBIN AND HEMATOCRIT, BLOOD
HCT: 38.2 % — ABNORMAL LOW (ref 39.0–52.0)
Hemoglobin: 13 g/dL (ref 13.0–17.0)

## 2023-12-21 SURGERY — CORONARY STENT INTERVENTION
Anesthesia: Moderate Sedation

## 2023-12-21 MED ORDER — LIDOCAINE HCL (PF) 1 % IJ SOLN
INTRAMUSCULAR | Status: DC | PRN
  Administered 2023-12-21: 2 mL

## 2023-12-21 MED ORDER — HYDRALAZINE HCL 20 MG/ML IJ SOLN: 10.0000 mg | INTRAMUSCULAR | Status: AC | PRN

## 2023-12-21 MED ORDER — ATORVASTATIN CALCIUM 80 MG PO TABS
80.0000 mg | ORAL_TABLET | Freq: Every day | ORAL | 11 refills | Status: AC
  Filled 2023-12-21: qty 30, 30d supply, fill #0

## 2023-12-21 MED ORDER — HEPARIN SODIUM (PORCINE) 1000 UNIT/ML IJ SOLN
INTRAMUSCULAR | Status: AC
  Filled 2023-12-21: qty 10

## 2023-12-21 MED ORDER — MIDAZOLAM HCL 2 MG/2ML IJ SOLN
INTRAMUSCULAR | Status: DC | PRN
  Administered 2023-12-21: 1 mg via INTRAVENOUS

## 2023-12-21 MED ORDER — FENTANYL CITRATE (PF) 100 MCG/2ML IJ SOLN
INTRAMUSCULAR | Status: AC
  Filled 2023-12-21: qty 2

## 2023-12-21 MED ORDER — HEPARIN SODIUM (PORCINE) 1000 UNIT/ML IJ SOLN
INTRAMUSCULAR | Status: DC | PRN
  Administered 2023-12-21: 10000 [IU] via INTRAVENOUS

## 2023-12-21 MED ORDER — ASPIRIN 81 MG PO CHEW
81.0000 mg | CHEWABLE_TABLET | Freq: Every day | ORAL | 11 refills | Status: AC
  Filled 2023-12-21: qty 30, 30d supply, fill #0

## 2023-12-21 MED ORDER — HEPARIN (PORCINE) IN NACL 2000-0.9 UNIT/L-% IV SOLN
INTRAVENOUS | Status: DC | PRN
  Administered 2023-12-21: 1000 mL

## 2023-12-21 MED ORDER — MORPHINE SULFATE (PF) 4 MG/ML IV SOLN
2.0000 mg | INTRAVENOUS | Status: DC | PRN
  Filled 2023-12-21: qty 1

## 2023-12-21 MED ORDER — SODIUM CHLORIDE 0.9 % IV SOLN
INTRAVENOUS | Status: AC | PRN
  Administered 2023-12-21: 999 mL/h via INTRAVENOUS

## 2023-12-21 MED ORDER — IOHEXOL 300 MG/ML  SOLN
INTRAMUSCULAR | Status: DC | PRN
  Administered 2023-12-21: 232 mL

## 2023-12-21 MED ORDER — SODIUM CHLORIDE 0.9 % IV SOLN: 250.0000 mL | INTRAVENOUS | Status: DC | PRN

## 2023-12-21 MED ORDER — SODIUM CHLORIDE 0.9 % IV BOLUS
500.0000 mL | Freq: Once | INTRAVENOUS | Status: AC
  Administered 2023-12-21: 500 mL via INTRAVENOUS

## 2023-12-21 MED ORDER — SODIUM CHLORIDE 0.9 % WEIGHT BASED INFUSION: 1.0000 mL/kg/h | INTRAVENOUS | Status: AC

## 2023-12-21 MED ORDER — TICAGRELOR 90 MG PO TABS
ORAL_TABLET | ORAL | Status: DC | PRN
  Administered 2023-12-21: 90 mg via ORAL

## 2023-12-21 MED ORDER — MIDAZOLAM HCL 2 MG/2ML IJ SOLN
INTRAMUSCULAR | Status: AC
  Filled 2023-12-21: qty 2

## 2023-12-21 MED ORDER — METOPROLOL TARTRATE 50 MG PO TABS
50.0000 mg | ORAL_TABLET | Freq: Two times a day (BID) | ORAL | 11 refills | Status: AC
  Filled 2023-12-21: qty 60, 30d supply, fill #0

## 2023-12-21 MED ORDER — TICAGRELOR 90 MG PO TABS: 90.0000 mg | ORAL_TABLET | Freq: Two times a day (BID) | ORAL | Status: DC

## 2023-12-21 MED ORDER — TICAGRELOR 90 MG PO TABS
ORAL_TABLET | ORAL | Status: AC
  Filled 2023-12-21: qty 1

## 2023-12-21 MED ORDER — FENTANYL CITRATE (PF) 100 MCG/2ML IJ SOLN
INTRAMUSCULAR | Status: DC | PRN
  Administered 2023-12-21: 25 ug via INTRAVENOUS

## 2023-12-21 MED ORDER — NITROGLYCERIN 1 MG/10 ML FOR IR/CATH LAB
INTRA_ARTERIAL | Status: AC
  Filled 2023-12-21: qty 10

## 2023-12-21 MED ORDER — SODIUM CHLORIDE 0.9% FLUSH: 3.0000 mL | INTRAVENOUS | Status: DC | PRN

## 2023-12-21 MED ORDER — VERAPAMIL HCL 2.5 MG/ML IV SOLN
INTRAVENOUS | Status: AC
  Filled 2023-12-21: qty 2

## 2023-12-21 MED ORDER — SODIUM CHLORIDE 0.9% FLUSH
3.0000 mL | Freq: Two times a day (BID) | INTRAVENOUS | Status: DC
  Administered 2023-12-21 – 2023-12-22 (×2): 3 mL via INTRAVENOUS

## 2023-12-21 MED ORDER — MORPHINE SULFATE (PF) 2 MG/ML IV SOLN
INTRAVENOUS | Status: AC
  Administered 2023-12-21: 2 mg via INTRAVENOUS
  Filled 2023-12-21: qty 1

## 2023-12-21 MED ORDER — LABETALOL HCL 5 MG/ML IV SOLN: 10.0000 mg | INTRAVENOUS | Status: AC | PRN

## 2023-12-21 MED ORDER — VERAPAMIL HCL 2.5 MG/ML IV SOLN
INTRAVENOUS | Status: DC | PRN
Start: 2023-12-21 — End: 2023-12-21
  Administered 2023-12-21: 2.5 mg via INTRA_ARTERIAL

## 2023-12-21 MED ORDER — TICAGRELOR 90 MG PO TABS
90.0000 mg | ORAL_TABLET | Freq: Two times a day (BID) | ORAL | 11 refills | Status: AC
Start: 2023-12-21 — End: ?
  Filled 2023-12-21: qty 60, 30d supply, fill #0

## 2023-12-21 MED ORDER — HEPARIN (PORCINE) IN NACL 1000-0.9 UT/500ML-% IV SOLN
INTRAVENOUS | Status: AC
  Filled 2023-12-21: qty 1000

## 2023-12-21 MED ORDER — ASPIRIN 81 MG PO CHEW: 81.0000 mg | CHEWABLE_TABLET | Freq: Every day | ORAL | Status: DC

## 2023-12-21 MED ORDER — NITROGLYCERIN 1 MG/10 ML FOR IR/CATH LAB
INTRA_ARTERIAL | Status: DC | PRN
  Administered 2023-12-21 (×4): 200 ug via INTRACORONARY

## 2023-12-21 SURGICAL SUPPLY — 24 items
BALLN MINITREK RX 2.0X12 (BALLOONS) ×1 IMPLANT
BALLN TREK RX 2.25X12 (BALLOONS) ×1 IMPLANT
BALLN ~~LOC~~ TREK NEO RX 2.5X12 (BALLOONS) ×1 IMPLANT
BALLOON MINITREK RX 2.0X12 (BALLOONS) IMPLANT
BALLOON TREK RX 2.25X12 (BALLOONS) IMPLANT
BALLOON ~~LOC~~ TREK NEO RX 2.5X12 (BALLOONS) IMPLANT
CATH INFINITI JR4 5F (CATHETERS) IMPLANT
CATH VISTA GUIDE 6FR JL4 (CATHETERS) IMPLANT
DEVICE RAD TR BAND REGULAR (VASCULAR PRODUCTS) IMPLANT
DRAPE BRACHIAL (DRAPES) IMPLANT
GLIDESHEATH SLEND SS 6F .021 (SHEATH) IMPLANT
GUIDEWIRE INQWIRE 1.5J.035X260 (WIRE) IMPLANT
INQWIRE 1.5J .035X260CM (WIRE) ×1 IMPLANT
KIT ENCORE 26 ADVANTAGE (KITS) IMPLANT
PACK CARDIAC CATH (CUSTOM PROCEDURE TRAY) ×1 IMPLANT
PROTECTION STATION PRESSURIZED (MISCELLANEOUS) ×1 IMPLANT
SET ATX-X65L (MISCELLANEOUS) IMPLANT
STATION PROTECTION PRESSURIZED (MISCELLANEOUS) IMPLANT
STENT ONYX FRONTIER 2.0X12 (Permanent Stent) IMPLANT
STENT ONYX FRONTIER 2.25X08 (Permanent Stent) IMPLANT
STENT ONYX FRONTIER 2.25X15 (Permanent Stent) IMPLANT
TUBING CIL FLEX 10 FLL-RA (TUBING) IMPLANT
WIRE ASAHI PROWATER 180CM (WIRE) IMPLANT
WIRE G HI TQ BMW 190 (WIRE) IMPLANT

## 2023-12-21 NOTE — Progress Notes (Addendum)
 [facility] Cardiology  CARDIOLOGY PROGRESS NOTE  Patient ID: Samuel Brady MRN: [id number] DOB/AGE: 1955/12/26 68 y.o.  Admit date: 12/17/2023 Referring Physician York Cerise Primary Physician  Primary Cardiologist  Reason for Consultation inferior STEMI  HPI: 68 year old [place] gentleman referred for inferior STEMI.  History obtained from conversation from EMS and patient's son over the phone.  Patient has had a recent history of recent syncopal episodes.  The patient awoke from sleep with substernal chest pain and EMS was called.  Patient was brought to [facility] ED where ECG revealed ST elevations in leads II, III and aVF, and V4, V5 and V6 consistent with inferolateral STEMI.  The patient was brought urgently to the cardiac catheterization laboratory where coronary arteriography revealed the vessel coronary artery disease, with 80% stenosis mid LAD, 80% stenosis in mid to distal left circumflex, 99% ulcerative stenosis mid RCA likely representing the culprit lesion.  The patient underwent primary PCI receiving 2.75 x 22 mm Onyx Frontier stent in the mid RCA.  Left ventriculography revealed near normal left ventricular function, with estimate LVEF 50% with mild inferoapical hypokinesis.  Interval history: -Patient seen and examined this afternoon after LHC this AM. -Denies any chest pain or SOB. R wrist slightly sore but without active bleeding or significant bruising. -BP and heart rate remain stable. -Renal function remain stable as well.  Review of systems complete and found to be negative unless listed above     Past Medical History:  Diagnosis Date   Arthritis    Diabetes mellitus without complication (HCC)    Hyperlipidemia    Hypertension     Past Surgical History:  Procedure Laterality Date   CORONARY/GRAFT ACUTE MI REVASCULARIZATION N/A 12/17/2023   Procedure: Coronary/Graft Acute MI Revascularization;  Surgeon: Marcina Millard, MD;  Location: [facility] INVASIVE CV LAB;  Service:  Cardiovascular;  Laterality: N/A;   LEFT HEART CATH AND CORONARY ANGIOGRAPHY N/A 12/17/2023   Procedure: LEFT HEART CATH AND CORONARY ANGIOGRAPHY;  Surgeon: Marcina Millard, MD;  Location: [facility] INVASIVE CV LAB;  Service: Cardiovascular;  Laterality: N/A;    Medications Prior to Admission  Medication Sig Dispense Refill Last Dose/Taking   acetaminophen (TYLENOL) 500 MG tablet Take 500 mg by mouth every 6 (six) hours as needed.   Past Month   atorvastatin (LIPITOR) 40 MG tablet Take 40 mg by mouth at bedtime.   Past Week   lisinopril (PRINIVIL,ZESTRIL) 20 MG tablet Take 20 mg by mouth daily.   Past Week   metformin (FORTAMET) 500 MG (OSM) 24 hr tablet Take 500 mg by mouth 2 (two) times daily with a meal.   Past Week   loperamide (LOPERAMIDE A-D) 2 MG tablet Take 1 tablet (2 mg total) by mouth 4 (four) times daily as needed for diarrhea or loose stools. 30 tablet 0 Unknown   Social History   Socioeconomic History   Marital status: Married    Spouse name: Not on file   Number of children: Not on file   Years of education: Not on file   Highest education level: Not on file  Occupational History   Not on file  Tobacco Use   Smoking status: Former   Smokeless tobacco: Never   Tobacco comments:    Pt quit 20+yrs ago  Vaping Use   Vaping status: Never Used  Substance and Sexual Activity   Alcohol use: No   Drug use: No   Sexual activity: Not on file  Other Topics Concern   Not on file  Social History Narrative  Not on file   Social Drivers of Health   Financial Resource Strain: Not on file  Food Insecurity: No Food Insecurity (12/17/2023)   Hunger Vital Sign    Worried About Running Out of Food in the Last Year: Never true    Ran Out of Food in the Last Year: Never true  Transportation Needs: No Transportation Needs (12/17/2023)   PRAPARE - Administrator, Civil Service (Medical): No    Lack of Transportation (Non-Medical): No  Physical Activity: Not on file   Stress: Not on file  Social Connections: Socially Integrated (12/20/2023)   Social Connection and Isolation Panel [NHANES]    Frequency of Communication with Friends and Family: Three times a week    Frequency of Social Gatherings with Friends and Family: Three times a week    Attends Religious Services: More than 4 times per year    Active Member of Clubs or Organizations: Patient declined    Attends Banker Meetings: 1 to 4 times per year    Marital Status: Married  Catering manager Violence: Not At Risk (12/17/2023)   Humiliation, Afraid, Rape, and Kick questionnaire    Fear of Current or Ex-Partner: No    Emotionally Abused: No    Physically Abused: No    Sexually Abused: No    Family History  Problem Relation Age of Onset   Heart disease Mother        Patient was [age] year old; died while pregnant.  Patient told it may have been a heart attack.   Heart Problems Sister    Heart attack Brother 75   Brain cancer Sister    Diabetes Brother       Review of systems complete and found to be negative unless listed above    PHYSICAL EXAM  General: Well developed, well nourished, in no acute distress HEENT:  Normocephalic and atramatic Neck:  No JVD.  Lungs: Clear bilaterally to auscultation and percussion. Heart: HRRR . Normal S1 and S2 without gallops or murmurs.  Abdomen: Bowel sounds are positive, abdomen soft and non-tender  Msk:  Back normal, normal gait. Normal strength and tone for age. Extremities: No clubbing, cyanosis or edema.  R wrist access site without significant bruising, hematoma. Mild tenderness to palpation. Neuro: Alert and oriented X 3. Psych:  Good affect, responds appropriately  Labs:   Lab Results  Component Value Date   WBC 12.0 (H) 12/21/2023   HGB 14.0 12/21/2023   HCT 40.9 12/21/2023   MCV 72.9 (L) 12/21/2023   PLT 262 12/21/2023    Recent Labs  Lab 12/17/23 0040 12/17/23 0537 12/21/23 0504  NA 134*   < > 138  K 3.9   < >  4.3  CL 104   < > 104  CO2 25   < > 22  BUN 18   < > 19  CREATININE 1.09   < > 1.22  CALCIUM 8.7*   < > 9.4  PROT 7.6  --   --   BILITOT 0.6  --   --   ALKPHOS 64  --   --   ALT 30  --   --   AST 30  --   --   GLUCOSE 168*   < > 119*   < > = values in this interval not displayed.   Lab Results  Component Value Date   TROPONINI <0.03 11/02/2017    Lab Results  Component Value Date   CHOL  161 12/17/2023   Lab Results  Component Value Date   HDL 32 (L) 12/17/2023   Lab Results  Component Value Date   LDLCALC 80 12/17/2023   Lab Results  Component Value Date   TRIG 245 (H) 12/17/2023   Lab Results  Component Value Date   CHOLHDL 5.0 12/17/2023   No results found for: "LDLDIRECT"    Radiology: CARDIAC CATHETERIZATION Result Date: 12/21/2023   Dist LAD lesion is 75% stenosed.   Ost Cx to Prox Cx lesion is 50% stenosed.   Prox RCA lesion is 40% stenosed.   Dist RCA lesion is 50% stenosed.   Mid LAD lesion is 80% stenosed.   Mid Cx to Dist Cx lesion is 80% stenosed.   1st Mrg lesion is 70% stenosed.   Prox Cx to Mid Cx lesion is 75% stenosed.   Non-stenotic Mid RCA lesion was previously treated.   A drug-eluting stent was successfully placed using a STENT ONYX FRONTIER 2.25X15.   A drug-eluting stent was successfully placed using a STENT ONYX FRONTIER 2.0X12.   A drug-eluting stent was successfully placed using a STENT ONYX FRONTIER 2.25X08.   Post intervention, there is a 0% residual stenosis.   Post intervention, there is a 0% residual stenosis.   Post intervention, there is a 0% residual stenosis. 1.  Patent stent mid RCA 2.  Successful PCI with 2.25 x 15 mm Onyx frontier DES mid LAD 3.  Successful PCI with 2.25 x 8 mm Onyx frontier DES mid left circumflex 4.  Successful PCI with 2.0 x 12 mm Onyx frontier DES proximal/mid left circumflex Recommendations 1.  Dual antiplatelet therapy uninterrupted x 1 year 2.  Aggressive risk factor modification 3.  Consider discharge later today  or in a.m.   ECHOCARDIOGRAM COMPLETE Result Date: 12/17/2023    ECHOCARDIOGRAM REPORT   Patient Name:   JARMEL Salser   Date of Exam: 12/17/2023 Medical Rec #:  [id number]  Height:       66.0 in Accession #:    [id number] Weight:       161.6 lb Date of Birth:  1956/06/26  BSA:          1.827 m Patient Age:    67 years   BP:           131/74 mmHg Patient Gender: M          HR:           87 bpm. Exam Location:  [facility] Procedure: 2D Echo, Cardiac Doppler and Color Doppler (Both Spectral and Color            Flow Doppler were utilized during procedure). Indications:     Acute myocardial infarction - unspecified I21.9  History:         Patient has no prior history of Echocardiogram examinations.                  Risk Factors:Hypertension, Dyslipidemia and Diabetes.  Sonographer:     Cristela Blue Referring Phys:  [id number] ALEXANDER PARASCHOS Diagnosing Phys: Mellody Drown Alluri IMPRESSIONS  1. Left ventricular ejection fraction, by estimation, is 60 to 65%. The left ventricle has normal function. The left ventricle has no regional wall motion abnormalities. There is mild left ventricular hypertrophy. Left ventricular diastolic parameters are consistent with Grade I diastolic dysfunction (impaired relaxation).  2. Right ventricular systolic function is normal. The right ventricular size is normal.  3. The mitral valve is normal in structure. Trivial mitral valve regurgitation.  4. The aortic valve is tricuspid. Aortic valve regurgitation is not visualized.  5. The inferior vena cava is normal in size with greater than 50% respiratory variability, suggesting right atrial pressure of 3 mmHg. FINDINGS  Left Ventricle: Left ventricular ejection fraction, by estimation, is 60 to 65%. The left ventricle has normal function. The left ventricle has no regional wall motion abnormalities. The left ventricular internal cavity size was normal in size. There is  mild left ventricular hypertrophy. Left ventricular diastolic parameters are consistent  with Grade I diastolic dysfunction (impaired relaxation). Right Ventricle: The right ventricular size is normal. No increase in right ventricular wall thickness. Right ventricular systolic function is normal. Left Atrium: Left atrial size was normal in size. Right Atrium: Right atrial size was normal in size. Pericardium: There is no evidence of pericardial effusion. Mitral Valve: The mitral valve is normal in structure. Mild mitral annular calcification. Trivial mitral valve regurgitation. Tricuspid Valve: The tricuspid valve is normal in structure. Tricuspid valve regurgitation is trivial. Aortic Valve: The aortic valve is tricuspid. Aortic valve regurgitation is not visualized. Aortic valve mean gradient measures 2.0 mmHg. Aortic valve peak gradient measures 3.7 mmHg. Aortic valve area, by VTI measures 2.79 cm. Pulmonic Valve: The pulmonic valve was not well visualized. Pulmonic valve regurgitation is not visualized. Aorta: The aortic root is normal in size and structure. Venous: The inferior vena cava is normal in size with greater than 50% respiratory variability, suggesting right atrial pressure of 3 mmHg. IAS/Shunts: The atrial septum is grossly normal.  LEFT VENTRICLE PLAX 2D LVIDd:         3.80 cm   Diastology LVIDs:         2.50 cm   LV e' medial:    4.90 cm/s LV PW:         1.30 cm   LV E/e' medial:  17.9 LV IVS:        1.20 cm   LV e' lateral:   10.60 cm/s LVOT diam:     2.00 cm   LV E/e' lateral: 8.3 LV SV:         50 LV SV Index:   28 LVOT Area:     3.14 cm  RIGHT VENTRICLE RV Basal diam:  3.00 cm RV Mid diam:    3.00 cm LEFT ATRIUM           Index        RIGHT ATRIUM           Index LA diam:      2.00 cm 1.09 cm/m   RA Area:     12.70 cm LA Vol (A2C): 28.0 ml 15.33 ml/m  RA Volume:   27.30 ml  14.95 ml/m LA Vol (A4C): 11.4 ml 6.24 ml/m  AORTIC VALVE AV Area (Vmax):    2.41 cm AV Area (Vmean):   2.54 cm AV Area (VTI):     2.79 cm AV Vmax:           96.60 cm/s AV Vmean:          67.500 cm/s AV  VTI:            0.180 m AV Peak Grad:      3.7 mmHg AV Mean Grad:      2.0 mmHg LVOT Vmax:         74.20 cm/s LVOT Vmean:        54.600 cm/s LVOT VTI:          0.160  m LVOT/AV VTI ratio: 0.89  AORTA Ao Root diam: 3.00 cm MITRAL VALVE                TRICUSPID VALVE MV Area (PHT): 3.87 cm     TR Peak grad:   10.8 mmHg MV Decel Time: 196 msec     TR Vmax:        164.00 cm/s MV E velocity: 87.50 cm/s MV A velocity: 130.00 cm/s  SHUNTS MV E/A ratio:  0.67         Systemic VTI:  0.16 m                             Systemic Diam: 2.00 cm Windell Norfolk Electronically signed by Windell Norfolk Signature Date/Time: 12/17/2023/3:28:56 PM    Final    CARDIAC CATHETERIZATION Result Date: 12/17/2023   Ost Cx to Prox Cx lesion is 50% stenosed.   Mid Cx to Dist Cx lesion is 80% stenosed.   Prox Cx to Mid Cx lesion is 60% stenosed.   Mid LAD lesion is 80% stenosed.   Dist LAD lesion is 75% stenosed.   Mid RCA lesion is 99% stenosed.   Prox RCA lesion is 40% stenosed.   Dist RCA lesion is 50% stenosed.   A drug-eluting stent was successfully placed using a STENT ONYX FRONTIER 2.75X22.   Post intervention, there is a 0% residual stenosis.   The left ventricular systolic function is normal.   The left ventricular ejection fraction is 50-55% by visual estimate. 1.  Inferior STEMI 2.  Three-vessel coronary artery disease with 80% stenosis mid LAD, 80% stenosis mid left circumflex, and 99% stenosis of mid RCA (culprit vessel) 3.  Near normal left ventricular function with estimate LVEF 50% with mild inferior apical hypokinesis 4.  Successful primary PCI with 2.75 x 22 mm Onyx Frontier DES mid RCA Recommendations 1.  Dual antiplatelet therapy uninterrupted x 1 year 2.  Start metoprolol succinate 25 mg daily 3.  Start atorvastatin 80 mg daily 4.  2D echocardiogram 5.  Staged PCI of mid LAD and mid left circumflex stenosis to be scheduled   EKG: Sinus rhythm with ST elevation in leads II, III and aVF, V4 V5 and V6  ASSESSMENT AND  PLAN:   1.  Inferolateral STEMI, three-vessel coronary artery disease, with 80% stenosis LAD, 80% stenosis mid left circumflex, and 99% ulcerative stenosis mid RCA (culprit lesion), status post successful primary PCI with 2.75 x 22 mm Onyx frontier DES mid RCA 2.  Essential hypertension, on lisinopril 3.  Hyperlipidemia, on atorvastatin 4.  Type 2 diabetes, on metformin  Recommendations 1.  Dual antiplatelet therapy (aspirin and Brilinta) uninterrupted x 1 year 2.  Continue metoprolol tartrate 50 mg twice daily 3.  Continue lisinopril 20 mg daily 4.  Continue atorvastatin to 80 mg daily 5.  Echo with preserved EF, no wall motion abnormalities. 6.  S/p PCI of LAD and LCx this AM. Tolerated procedure well without issues.  7.  Medications discussed in detail with patient and wife at bedside.   ADDENDUM: Patient had bleeding from R radial site around 2:45 PM. Pressure held by RN for 20 minutes. No bleeding/oozing upon removal of pressure. Pressure dressing placed, will keep on for 2 hours. Patient with significant tenderness to R wrist, will give ice pack. Also had transient hypotension and dizziness, to receive 500 mL bolus. Will check H+H. Keep overnight for observation.   This patient's  plan of care was discussed and created with Dr. Darrold Junker and he is in agreement.    SignedGale Journey, PA-C  12/21/2023, 10:51 AM

## 2023-12-21 NOTE — Progress Notes (Signed)
 1540: patient called out stating his arm was bleeding. This RN to bedside. Noted band aid over right radial site saturated with blood and blood on sheet and floor. Removed band aid and noted bleeding and hematoma at radial site. Applied manual pressure with guaze. BP 133/75. PA Hudson made aware via secure chat.   Patient complaining of severe pain with pressure. After approx. 10 min. patient stating he feels hot and sweaty, is nauseas, and is having blurry vision. Noted patient to be paler in color. BP 78/63. PA Hudson and MD Lucianne Muss asked to come to bedside. 2 liters O2 applied. Zosyn administered. MDs to bedside and patient laid flat in chair and transferred via sheet from chair to bed. 500 NS bolus initiated.   1614: vitals WNL, BP 117/76. All prior symptoms resolved. Pressure dressing applied to radial site. Verbal order to remove pressure dressing at 1800 and placed guaze dressing with tegaderm.

## 2023-12-21 NOTE — Progress Notes (Signed)
 Pressure dressing removed and guaze with tegaderm placed. Patient only complains of tenderness upon moving extremity.

## 2023-12-21 NOTE — Progress Notes (Signed)
 Progress Note   Patient: Samuel Brady ONG:[id number] DOB: 1956/02/23 DOA: 12/17/2023     4 DOS: the patient was seen and examined on 12/21/2023   Brief hospital course: Hester Forget is a 68 y.o. male with medical history significant of hypertension, hyperlipidemia, type 2 diabetes presenting with STEMI.  Patient reports waking up overnight with mild to severe central chest pain.  Chest pain with some radiation into the right arm.  Positive nausea and shortness of breath.  Denies any prior episodes like this in the past.  No reported tobacco or alcohol use.  Symptoms persisted at home.  Noted syncopal episode within the past month.  No focal hemiparesis or confusion.  EMS was subsequently called by patient's son.  Given full dose aspirin as well as nitroglycerin en route to the ER.  Patient does report significant family history of coronary disease. Presented to the ER afebrile, blood pressures 140s to 170s over 90s to 80s.  EKG with noted ST elevations in inferior and lateral leads.  White count 8.5, hemoglobin 13.3, platelets 224, creatinine 1.09.  Glucose 168.  Code STEMI called with patient was emergently sent to the Cath Lab.  Troponin 285-316.  Cardiac cath showed Inferior STEMI, three-vessel coronary artery disease with 80% stenosis mid LAD, 80% stenosis mid left circumflex, and 99% stenosis of mid RCA (culprit vessel), near normal left ventricular function with estimate LVEF 50% with mild  inferior apical hypokinesis. Patient underwent primary PCI receiving 2.75 x 22 mm Onyx Frontier stent in the mid RCA. Cardiology planned for staged PCI which was done on Monday 3/31.  Assessment and Plan:  # Inferior wall MI # STEMI (ST elevation myocardial infarction) (HCC) Moderate to severe central chest pain that woke patient up from sleep last night. EKG with noted inferolateral STEMI-code STEMI activated Coronary arteriography showing 80% stenosis mid LAD, 80% stenosis in mid to distal left circumflex, 99%  ulcerative stenosis mid RCA s/p placement of DES to mid RCA. On aspirin and Brilinta x 1 year uninterrupted Continue statin therapy. Appreciate Cardiology recommendations. S/p staged PCI LAD and LCX stents done today on 3/31. Mild right radial bleeding and hematoma status post cardiac cath.  Pressure applied and bleeding stopped, we will continue ice pack. Monitor H&H BP dropped, NS 500 mL bolus given We will continue to watch overnight and plan for discharge tomorrow a.m.    Type 2 diabetes mellitus A1c 7.5, Blood sugar 160s Continue accucheks, sliding scale as per protocol.   Hyperlipidemia Continue Lipitor 80mg  daily.   Essential hypertension BP stable.  On ACE inhibitor, continue lisinopril 10 mg p.o. daily Metoprolol 50 bid added. Continue to monitor BP and titrate medications accordingly.     Antitussive medications for his cough. Out of bed to chair. Incentive spirometry. Nursing supportive care. Fall, aspiration precautions. Diet:  Diet Orders (From admission, onward)     Start     Ordered   12/21/23 1041  Diet Heart Room service appropriate? Yes; Fluid consistency: Thin  Diet effective now       Question Answer Comment  Room service appropriate? Yes   Fluid consistency: Thin      12/21/23 1040           DVT prophylaxis: enoxaparin (LOVENOX) injection 40 mg Start: 12/17/23 1000  Level of care: Step down.   Code Status: Full Code  Subjective: Patient was seen and examined during morning rounds, after the cardiac cath, tolerated procedure well.  Patient was asymptomatic, wanted to go home so  plan was to discharge him later in the day. Around 4 PM patient had hypotension and bleeding from the radial access.  Pressure was applied and we will continue ice pack, bleeding stopped, noticed some hematoma. BP was low, NS fluid bolus 500 mL was given, BP improved. Plan is to monitor overnight and discharge tomorrow a.m.   Physical Exam: Vitals:   12/21/23 1200  12/21/23 1300 12/21/23 1400 12/21/23 1430  BP: 116/75 136/88 119/89 (!) 144/83  Pulse: 89 89 96 (!) 103  Resp: 17 17 17 17   Temp:      TempSrc:      SpO2: 97% 98% 91% 96%  Weight:      Height:        General - Elderly Asian male, sitting in no apparent distress HEENT - PERRLA, EOMI, atraumatic head, non tender sinuses. Lung - Clear, basal rales, no rhonchi, wheezes. Heart - S1, S2 heard, no murmurs, rubs, trace pedal edema. Abdomen - Soft, non tender, obese, bowel sounds good Neuro - Alert, awake and oriented x 3, non focal exam. Skin - Warm and dry.  Data Reviewed:      Latest Ref Rng & Units 12/21/2023    5:04 AM 12/20/2023    2:54 AM 12/19/2023    5:56 AM  CBC  WBC 4.0 - 10.5 K/uL 12.0  14.0  13.9   Hemoglobin 13.0 - 17.0 g/dL 16.1  09.6  04.5   Hematocrit 39.0 - 52.0 % 40.9  40.9  41.6   Platelets 150 - 400 K/uL 262  275  261       Latest Ref Rng & Units 12/21/2023    5:04 AM 12/20/2023    2:54 AM 12/19/2023    5:56 AM  BMP  Glucose 70 - 99 mg/dL 409  811  914   BUN 8 - 23 mg/dL 19  21  18    Creatinine 0.61 - 1.24 mg/dL 7.82  9.56  2.13   Sodium 135 - 145 mmol/L 138  134  134   Potassium 3.5 - 5.1 mmol/L 4.3  4.0  3.9   Chloride 98 - 111 mmol/L 104  100  101   CO2 22 - 32 mmol/L 22  21  23    Calcium 8.9 - 10.3 mg/dL 9.4  9.3  9.3    CARDIAC CATHETERIZATION Result Date: 12/21/2023   Dist LAD lesion is 75% stenosed.   Ost Cx to Prox Cx lesion is 50% stenosed.   Prox RCA lesion is 40% stenosed.   Dist RCA lesion is 50% stenosed.   Mid LAD lesion is 80% stenosed.   Mid Cx to Dist Cx lesion is 80% stenosed.   1st Mrg lesion is 70% stenosed.   Prox Cx to Mid Cx lesion is 75% stenosed.   Non-stenotic Mid RCA lesion was previously treated.   A drug-eluting stent was successfully placed using a STENT ONYX FRONTIER 2.25X15.   A drug-eluting stent was successfully placed using a STENT ONYX FRONTIER 2.0X12.   A drug-eluting stent was successfully placed using a STENT ONYX FRONTIER  2.25X08.   Post intervention, there is a 0% residual stenosis.   Post intervention, there is a 0% residual stenosis.   Post intervention, there is a 0% residual stenosis. 1.  Patent stent mid RCA 2.  Successful PCI with 2.25 x 15 mm Onyx frontier DES mid LAD 3.  Successful PCI with 2.25 x 8 mm Onyx frontier DES mid left circumflex 4.  Successful PCI with 2.0 x  12 mm Onyx frontier DES proximal/mid left circumflex Recommendations 1.  Dual antiplatelet therapy uninterrupted x 1 year 2.  Aggressive risk factor modification 3.  Consider discharge later today or in a.m.     Family Communication: Discussed with patient, family. They understand and agree. All questions answered.  Disposition: Status is: Inpatient Remains inpatient appropriate because: staged PCI done today, DC tomorrow a.m.  Planned Discharge Destination: Home     Time spent: 55 minutes  Author: Gillis Santa, MD 12/21/2023 4:24 PM Secure chat 7am to 7pm For on call review [email or web address].

## 2023-12-21 NOTE — Progress Notes (Signed)
 Patient ambulated around unit 3 times with this RN. No change in vitals. No complaints of chest pain, dizziness, or SOB.

## 2023-12-21 NOTE — Plan of Care (Signed)
  Problem: Education: Goal: Knowledge of General Education information will improve Description: Including pain rating scale, medication(s)/side effects and non-pharmacologic comfort measures Outcome: Progressing   Problem: Health Behavior/Discharge Planning: Goal: Ability to manage health-related needs will improve Outcome: Progressing   Problem: Clinical Measurements: Goal: Ability to maintain clinical measurements within normal limits will improve Outcome: Progressing Goal: Will remain free from infection Outcome: Progressing Goal: Diagnostic test results will improve Outcome: Progressing Goal: Respiratory complications will improve Outcome: Progressing Goal: Cardiovascular complication will be avoided Outcome: Progressing   Problem: Activity: Goal: Risk for activity intolerance will decrease Outcome: Progressing   Problem: Nutrition: Goal: Adequate nutrition will be maintained Outcome: Progressing   Problem: Coping: Goal: Level of anxiety will decrease Outcome: Progressing   Problem: Elimination: Goal: Will not experience complications related to bowel motility Outcome: Progressing Goal: Will not experience complications related to urinary retention Outcome: Progressing   Problem: Pain Managment: Goal: General experience of comfort will improve and/or be controlled Outcome: Progressing   Problem: Safety: Goal: Ability to remain free from injury will improve Outcome: Progressing   Problem: Skin Integrity: Goal: Risk for impaired skin integrity will decrease Outcome: Progressing   Problem: Education: Goal: Understanding of CV disease, CV risk reduction, and recovery process will improve Outcome: Progressing Goal: Individualized Educational Video(s) Outcome: Progressing   Problem: Activity: Goal: Ability to return to baseline activity level will improve Outcome: Progressing   Problem: Cardiovascular: Goal: Ability to achieve and maintain adequate  cardiovascular perfusion will improve Outcome: Progressing Goal: Vascular access site(s) Level 0-1 will be maintained Outcome: Progressing   Problem: Health Behavior/Discharge Planning: Goal: Ability to safely manage health-related needs after discharge will improve Outcome: Progressing   Problem: Education: Goal: Ability to describe self-care measures that may prevent or decrease complications (Diabetes Survival Skills Education) will improve Outcome: Progressing Goal: Individualized Educational Video(s) Outcome: Progressing   Problem: Coping: Goal: Ability to adjust to condition or change in health will improve Outcome: Progressing   Problem: Fluid Volume: Goal: Ability to maintain a balanced intake and output will improve Outcome: Progressing   Problem: Health Behavior/Discharge Planning: Goal: Ability to identify and utilize available resources and services will improve Outcome: Progressing Goal: Ability to manage health-related needs will improve Outcome: Progressing   Problem: Metabolic: Goal: Ability to maintain appropriate glucose levels will improve Outcome: Progressing   Problem: Nutritional: Goal: Maintenance of adequate nutrition will improve Outcome: Progressing Goal: Progress toward achieving an optimal weight will improve Outcome: Progressing   Problem: Skin Integrity: Goal: Risk for impaired skin integrity will decrease Outcome: Progressing   Problem: Tissue Perfusion: Goal: Adequacy of tissue perfusion will improve Outcome: Progressing   Problem: Education: Goal: Understanding of cardiac disease, CV risk reduction, and recovery process will improve Outcome: Progressing Goal: Individualized Educational Video(s) Outcome: Progressing   Problem: Activity: Goal: Ability to tolerate increased activity will improve Outcome: Progressing   Problem: Cardiac: Goal: Ability to achieve and maintain adequate cardiovascular perfusion will improve Outcome:  Progressing   Problem: Health Behavior/Discharge Planning: Goal: Ability to safely manage health-related needs after discharge will improve Outcome: Progressing

## 2023-12-22 ENCOUNTER — Encounter: Payer: Self-pay | Admitting: Cardiology

## 2023-12-22 DIAGNOSIS — I2119 ST elevation (STEMI) myocardial infarction involving other coronary artery of inferior wall: Secondary | ICD-10-CM | POA: Diagnosis not present

## 2023-12-22 LAB — CBC
HCT: 37.3 % — ABNORMAL LOW (ref 39.0–52.0)
Hemoglobin: 12.7 g/dL — ABNORMAL LOW (ref 13.0–17.0)
MCH: 25.5 pg — ABNORMAL LOW (ref 26.0–34.0)
MCHC: 34 g/dL (ref 30.0–36.0)
MCV: 74.7 fL — ABNORMAL LOW (ref 80.0–100.0)
Platelets: 223 10*3/uL (ref 150–400)
RBC: 4.99 MIL/uL (ref 4.22–5.81)
RDW: 12.5 % (ref 11.5–15.5)
WBC: 11.3 10*3/uL — ABNORMAL HIGH (ref 4.0–10.5)
nRBC: 0 % (ref 0.0–0.2)

## 2023-12-22 LAB — BASIC METABOLIC PANEL WITH GFR
Anion gap: 9 (ref 5–15)
BUN: 20 mg/dL (ref 8–23)
CO2: 21 mmol/L — ABNORMAL LOW (ref 22–32)
Calcium: 8.9 mg/dL (ref 8.9–10.3)
Chloride: 104 mmol/L (ref 98–111)
Creatinine, Ser: 1.27 mg/dL — ABNORMAL HIGH (ref 0.61–1.24)
GFR, Estimated: >60 mL/min (ref >60)
Glucose, Bld: 107 mg/dL — ABNORMAL HIGH (ref 70–99)
Potassium: 3.9 mmol/L (ref 3.5–5.1)
Sodium: 134 mmol/L — ABNORMAL LOW (ref 135–145)

## 2023-12-22 LAB — GLUCOSE, CAPILLARY: Glucose-Capillary: 115 mg/dL — ABNORMAL HIGH (ref 70–99)

## 2023-12-22 NOTE — Discharge Summary (Signed)
 [facility] Discharge Summary   Patient: Samuel Brady NWG:[id number]  PCP: Judeen Hammans, MD  Date of admission: 12/17/2023   Date of discharge:  12/22/2023     Discharge Diagnoses:  Principal Problem:   STEMI involving oth coronary artery of inferior wall (HCC) Active Problems:   STEMI (ST elevation myocardial infarction) (HCC)   Essential hypertension   Hyperlipidemia   Type 2 diabetes mellitus (HCC)   Admitted From: Home Disposition:  Home   Recommendations for Outpatient Follow-up:  Follow-up with PCP in 1 week, repeat CBC and BMP in 1 week. Follow-up with cardiology in 1 week Follow up LABS/TEST:     Follow-up Information     Alluri, Meryl Dare, MD. Go in 1 week(s).   Specialty: Cardiology Why: APPT ON APRIL 7TH, MONDAY AT 9:30. Contact information: [address] [place] [address] [phone]         Judeen Hammans, MD Follow up in 1 week(s).   Specialty: Family Medicine Why: APPT ON APRIL 10TH, THURSDAY AT 1 PM Contact information: [address] [place] [address] [phone]                Diet recommendation: Cardiac and Carb modified diet  Activity: The patient is advised to gradually reintroduce usual activities, as tolerated  Discharge Condition: stable  Code Status: Full code   History of present illness: As per the H and P dictated on admission Hospital Course:  Samuel Brady is a 68 y.o. male with medical history significant of hypertension, hyperlipidemia, type 2 diabetes presenting with STEMI.  Patient reports waking up overnight with mild to severe central chest pain.  Chest pain with some radiation into the right arm.  Positive nausea and shortness of breath.  Denies any prior episodes like this in the past.  No reported tobacco or alcohol use.  Symptoms persisted at home.  Noted syncopal episode within the past month.  No focal hemiparesis or confusion.  EMS was subsequently called by patient's son.  Given  full dose aspirin as well as nitroglycerin en route to the ER.  Patient does report significant family history of coronary disease. Presented to the ER afebrile, blood pressures 140s to 170s over 90s to 80s.  EKG with noted ST elevations in inferior and lateral leads.  White count 8.5, hemoglobin 13.3, platelets 224, creatinine 1.09.  Glucose 168.  Code STEMI called with patient was emergently sent to the Cath Lab.  Troponin 285-316.   Cardiac cath showed Inferior STEMI, three-vessel coronary artery disease with 80% stenosis mid LAD, 80% stenosis mid left circumflex, and 99% stenosis of mid RCA (culprit vessel), near normal left ventricular function with estimate LVEF 50% with mild  inferior apical hypokinesis. Patient underwent primary PCI receiving 2.75 x 22 mm Onyx Frontier stent in the mid RCA. Cardiology planned for staged PCI which was done on Monday 3/31.   Assessment and Plan:   # Inferior wall MI # STEMI (ST elevation myocardial infarction) Moderate to severe central chest pain that woke patient up from sleep last night. EKG with noted inferolateral STEMI-code STEMI activated Coronary arteriography showing 80% stenosis mid LAD, 80% stenosis in mid to distal left circumflex, 99% ulcerative stenosis mid RCA s/p placement of DES to mid RCA done on 3/27 Started aspirin and Brilinta x 1 year uninterrupted. Continue statin therapy. Cardiology consulted, S/p staged PCI LAD and LCX stents done on 3/31. Mild right radial bleeding and hematoma status post cardiac cath.  Pressure  applied and bleeding stopped, we will continue ice pack.  H&H remained stable. BP dropped, NS 500 mL bolus given, BP improved. Patient was monitored overnight, remained stable, no further hematoma noticed on the right wrist. Patient was seen by cardiology, cleared for discharge.  Patient agreed with the discharge planning.    # Type 2 diabetes mellitus: A1c 7.5, resumed home dose metformin.  Continue diabetic diet, monitor  CBG at home. # Hyperlipidemia: Continue Lipitor 80mg  daily. # Essential hypertension: Continue lisinopril 20 mg p.o. daily, started metoprolol 50 mg p.o. twice daily.  Patient was advised to monitor BP at home and follow with PCP and cardiology as an outpatient.    Body mass index is 26.08 kg/m.  Nutrition Interventions:  - Patient was instructed, not to drive, operate heavy machinery, perform activities at heights, swimming or participation in water activities or provide baby sitting services while on Pain, Sleep and Anxiety Medications; until his outpatient Physician has advised to do so again.  - Also recommended to not to take more than prescribed Pain, Sleep and Anxiety Medications.  Patient was ambulatory without any assistance. On the day of the discharge the patient's vitals were stable, and no other acute medical condition were reported by patient. the patient was felt safe to be discharge at Home.  Consultants: Cardiology Procedures: s/p Cardiac cath and PCI as above   Discharge Exam: General: Appear in no distress, no Rash; Oral Mucosa Clear, moist. Cardiovascular: S1 and S2 Present, no Murmur, Respiratory: normal respiratory effort, Bilateral Air entry present and no Crackles, no wheezes Abdomen: Bowel Sound present, Soft and no tenderness, no hernia Extremities: no Pedal edema, no calf tenderness Neurology: alert and oriented to time, place, and person affect appropriate.  Filed Weights   12/17/23 0043 12/17/23 0230 12/20/23 1925  Weight: 76.2 kg 73.3 kg 73.3 kg   Vitals:   12/22/23 0900 12/22/23 1000  BP:    Pulse: 85 81  Resp: 20 14  Temp:    SpO2: 96% 95%    DISCHARGE MEDICATION: Allergies as of 12/22/2023   No Known Allergies      Medication List     TAKE these medications    acetaminophen 500 MG tablet Commonly known as: TYLENOL Take 500 mg by mouth every 6 (six) hours as needed.   Aspirin Low Dose 81 MG chewable tablet Generic drug:  aspirin Chew 1 tablet (81 mg total) by mouth daily.   atorvastatin 80 MG tablet Commonly known as: LIPITOR Take 1 tablet (80 mg total) by mouth at bedtime. What changed:  medication strength how much to take   Brilinta 90 MG Tabs tablet Generic drug: ticagrelor Take 1 tablet (90 mg total) by mouth 2 (two) times daily.   lisinopril 20 MG tablet Commonly known as: ZESTRIL Take 20 mg by mouth daily.   loperamide 2 MG tablet Commonly known as: Loperamide A-D Take 1 tablet (2 mg total) by mouth 4 (four) times daily as needed for diarrhea or loose stools.   metformin 500 MG (OSM) 24 hr tablet Commonly known as: FORTAMET Take 500 mg by mouth 2 (two) times daily with a meal.   metoprolol tartrate 50 MG tablet Commonly known as: LOPRESSOR Take 1 tablet (50 mg total) by mouth 2 (two) times daily.       No Known Allergies Discharge Instructions     AMB Referral to Cardiac Rehabilitation - Phase II   Complete by: As directed    Diagnosis:  STEMI Coronary Stents  After initial evaluation and assessments completed: Virtual Based Care may be provided alone or in conjunction with Phase 2 Cardiac Rehab based on patient barriers.: Yes   Intensive Cardiac Rehabilitation (ICR) [facility] location only OR Traditional Cardiac Rehabilitation (TCR) *If criteria for ICR are not met will enroll in TCR [facility] only): Yes   AMB Referral to Cardiac Rehabilitation - Phase II   Complete by: As directed    Diagnosis: Coronary Stents   After initial evaluation and assessments completed: Virtual Based Care may be provided alone or in conjunction with Phase 2 Cardiac Rehab based on patient barriers.: Yes   Intensive Cardiac Rehabilitation (ICR) [facility] location only OR Traditional Cardiac Rehabilitation (TCR) *If criteria for ICR are not met will enroll in TCR [facility] only): Yes   Call MD for:   Complete by: As directed    Chest pain or palpitations, any shortness of breath.   Call MD for:  difficulty breathing,  headache or visual disturbances   Complete by: As directed    Call MD for:  extreme fatigue   Complete by: As directed    Call MD for:  persistant dizziness or light-headedness   Complete by: As directed    Call MD for:  persistant nausea and vomiting   Complete by: As directed    Call MD for:  severe uncontrolled pain   Complete by: As directed    Call MD for:  temperature >100.4   Complete by: As directed    Diet - low sodium heart healthy   Complete by: As directed    Discharge instructions   Complete by: As directed    Follow-up with PCP in 1 week, repeat CBC and BMP in 1 week. Follow-up with cardiology in 1 week   Increase activity slowly   Complete by: As directed        The results of significant diagnostics from this hospitalization (including imaging, microbiology, ancillary and laboratory) are listed below for reference.    Significant Diagnostic Studies: CARDIAC CATHETERIZATION Result Date: 12/21/2023   Dist LAD lesion is 75% stenosed.   Ost Cx to Prox Cx lesion is 50% stenosed.   Prox RCA lesion is 40% stenosed.   Dist RCA lesion is 50% stenosed.   Mid LAD lesion is 80% stenosed.   Mid Cx to Dist Cx lesion is 80% stenosed.   1st Mrg lesion is 70% stenosed.   Prox Cx to Mid Cx lesion is 75% stenosed.   Non-stenotic Mid RCA lesion was previously treated.   A drug-eluting stent was successfully placed using a STENT ONYX FRONTIER 2.25X15.   A drug-eluting stent was successfully placed using a STENT ONYX FRONTIER 2.0X12.   A drug-eluting stent was successfully placed using a STENT ONYX FRONTIER 2.25X08.   Post intervention, there is a 0% residual stenosis.   Post intervention, there is a 0% residual stenosis.   Post intervention, there is a 0% residual stenosis. 1.  Patent stent mid RCA 2.  Successful PCI with 2.25 x 15 mm Onyx frontier DES mid LAD 3.  Successful PCI with 2.25 x 8 mm Onyx frontier DES mid left circumflex 4.  Successful PCI with 2.0 x 12 mm Onyx frontier DES  proximal/mid left circumflex Recommendations 1.  Dual antiplatelet therapy uninterrupted x 1 year 2.  Aggressive risk factor modification 3.  Consider discharge later today or in a.m.   ECHOCARDIOGRAM COMPLETE Result Date: 12/17/2023    ECHOCARDIOGRAM REPORT   Patient Name:   Tailor Brownrigg  Date of Exam: 12/17/2023 Medical Rec #:  [id number]  Height:       66.0 in Accession #:    [id number] Weight:       161.6 lb Date of Birth:  04/06/1956  BSA:          1.827 m Patient Age:    67 years   BP:           131/74 mmHg Patient Gender: M          HR:           87 bpm. Exam Location:  [facility] Procedure: 2D Echo, Cardiac Doppler and Color Doppler (Both Spectral and Color            Flow Doppler were utilized during procedure). Indications:     Acute myocardial infarction - unspecified I21.9  History:         Patient has no prior history of Echocardiogram examinations.                  Risk Factors:Hypertension, Dyslipidemia and Diabetes.  Sonographer:     Cristela Blue Referring Phys:  [id number] ALEXANDER PARASCHOS Diagnosing Phys: Mellody Drown Alluri IMPRESSIONS  1. Left ventricular ejection fraction, by estimation, is 60 to 65%. The left ventricle has normal function. The left ventricle has no regional wall motion abnormalities. There is mild left ventricular hypertrophy. Left ventricular diastolic parameters are consistent with Grade I diastolic dysfunction (impaired relaxation).  2. Right ventricular systolic function is normal. The right ventricular size is normal.  3. The mitral valve is normal in structure. Trivial mitral valve regurgitation.  4. The aortic valve is tricuspid. Aortic valve regurgitation is not visualized.  5. The inferior vena cava is normal in size with greater than 50% respiratory variability, suggesting right atrial pressure of 3 mmHg. FINDINGS  Left Ventricle: Left ventricular ejection fraction, by estimation, is 60 to 65%. The left ventricle has normal function. The left ventricle has no regional wall motion  abnormalities. The left ventricular internal cavity size was normal in size. There is  mild left ventricular hypertrophy. Left ventricular diastolic parameters are consistent with Grade I diastolic dysfunction (impaired relaxation). Right Ventricle: The right ventricular size is normal. No increase in right ventricular wall thickness. Right ventricular systolic function is normal. Left Atrium: Left atrial size was normal in size. Right Atrium: Right atrial size was normal in size. Pericardium: There is no evidence of pericardial effusion. Mitral Valve: The mitral valve is normal in structure. Mild mitral annular calcification. Trivial mitral valve regurgitation. Tricuspid Valve: The tricuspid valve is normal in structure. Tricuspid valve regurgitation is trivial. Aortic Valve: The aortic valve is tricuspid. Aortic valve regurgitation is not visualized. Aortic valve mean gradient measures 2.0 mmHg. Aortic valve peak gradient measures 3.7 mmHg. Aortic valve area, by VTI measures 2.79 cm. Pulmonic Valve: The pulmonic valve was not well visualized. Pulmonic valve regurgitation is not visualized. Aorta: The aortic root is normal in size and structure. Venous: The inferior vena cava is normal in size with greater than 50% respiratory variability, suggesting right atrial pressure of 3 mmHg. IAS/Shunts: The atrial septum is grossly normal.  LEFT VENTRICLE PLAX 2D LVIDd:         3.80 cm   Diastology LVIDs:         2.50 cm   LV e' medial:    4.90 cm/s LV PW:         1.30 cm   LV E/e' medial:  17.9 LV IVS:  1.20 cm   LV e' lateral:   10.60 cm/s LVOT diam:     2.00 cm   LV E/e' lateral: 8.3 LV SV:         50 LV SV Index:   28 LVOT Area:     3.14 cm  RIGHT VENTRICLE RV Basal diam:  3.00 cm RV Mid diam:    3.00 cm LEFT ATRIUM           Index        RIGHT ATRIUM           Index LA diam:      2.00 cm 1.09 cm/m   RA Area:     12.70 cm LA Vol (A2C): 28.0 ml 15.33 ml/m  RA Volume:   27.30 ml  14.95 ml/m LA Vol (A4C): 11.4  ml 6.24 ml/m  AORTIC VALVE AV Area (Vmax):    2.41 cm AV Area (Vmean):   2.54 cm AV Area (VTI):     2.79 cm AV Vmax:           96.60 cm/s AV Vmean:          67.500 cm/s AV VTI:            0.180 m AV Peak Grad:      3.7 mmHg AV Mean Grad:      2.0 mmHg LVOT Vmax:         74.20 cm/s LVOT Vmean:        54.600 cm/s LVOT VTI:          0.160 m LVOT/AV VTI ratio: 0.89  AORTA Ao Root diam: 3.00 cm MITRAL VALVE                TRICUSPID VALVE MV Area (PHT): 3.87 cm     TR Peak grad:   10.8 mmHg MV Decel Time: 196 msec     TR Vmax:        164.00 cm/s MV E velocity: 87.50 cm/s MV A velocity: 130.00 cm/s  SHUNTS MV E/A ratio:  0.67         Systemic VTI:  0.16 m                             Systemic Diam: 2.00 cm Windell Norfolk Electronically signed by Windell Norfolk Signature Date/Time: 12/17/2023/3:28:56 PM    Final    CARDIAC CATHETERIZATION Result Date: 12/17/2023   Ost Cx to Prox Cx lesion is 50% stenosed.   Mid Cx to Dist Cx lesion is 80% stenosed.   Prox Cx to Mid Cx lesion is 60% stenosed.   Mid LAD lesion is 80% stenosed.   Dist LAD lesion is 75% stenosed.   Mid RCA lesion is 99% stenosed.   Prox RCA lesion is 40% stenosed.   Dist RCA lesion is 50% stenosed.   A drug-eluting stent was successfully placed using a STENT ONYX FRONTIER 2.75X22.   Post intervention, there is a 0% residual stenosis.   The left ventricular systolic function is normal.   The left ventricular ejection fraction is 50-55% by visual estimate. 1.  Inferior STEMI 2.  Three-vessel coronary artery disease with 80% stenosis mid LAD, 80% stenosis mid left circumflex, and 99% stenosis of mid RCA (culprit vessel) 3.  Near normal left ventricular function with estimate LVEF 50% with mild inferior apical hypokinesis 4.  Successful primary PCI with 2.75 x 22 mm Onyx Frontier DES mid RCA Recommendations  1.  Dual antiplatelet therapy uninterrupted x 1 year 2.  Start metoprolol succinate 25 mg daily 3.  Start atorvastatin 80 mg daily 4.  2D echocardiogram  5.  Staged PCI of mid LAD and mid left circumflex stenosis to be scheduled   Microbiology: Recent Results (from the past 240 hours)  MRSA Next Gen by PCR, Nasal     Status: None   Collection Time: 12/17/23  2:19 AM   Specimen: Nasal Mucosa; Nasal Swab  Result Value Ref Range Status   MRSA by PCR Next Gen NOT DETECTED NOT DETECTED Final    Comment: (NOTE) The GeneXpert MRSA Assay (FDA approved for NASAL specimens only), is one component of a comprehensive MRSA colonization surveillance program. It is not intended to diagnose MRSA infection nor to guide or monitor treatment for MRSA infections. Test performance is not FDA approved in patients less than 42 years old. Performed at [facility], [address] [place], [place] [address]      Labs: CBC: Recent Labs  Lab 12/17/23 0040 12/17/23 6045 12/19/23 0556 12/20/23 0254 12/21/23 0504 12/21/23 1639 12/22/23 0420  WBC 8.5 9.3 13.9* 14.0* 12.0*  --  11.3*  NEUTROABS 4.2  --   --   --   --   --   --   HGB 13.3 13.4 14.2 14.0 14.0 13.0 12.7*  HCT 39.2 38.4* 41.6 40.9 40.9 38.2* 37.3*  MCV 75.4* 74.0* 73.5* 73.2* 72.9*  --  74.7*  PLT 224 235 261 275 262  --  223   Basic Metabolic Panel: Recent Labs  Lab 12/17/23 0040 12/17/23 0537 12/18/23 0155 12/19/23 0556 12/20/23 0254 12/21/23 0504 12/22/23 0420  NA 134*   < > 138 134* 134* 138 134*  K 3.9   < > 3.8 3.9 4.0 4.3 3.9  CL 104   < > 105 101 100 104 104  CO2 25   < > 24 23 21* 22 21*  GLUCOSE 168*   < > 106* 145* 155* 119* 107*  BUN 18   < > 15 18 21 19 20   CREATININE 1.09   < > 1.14 1.08 1.19 1.22 1.27*  CALCIUM 8.7*   < > 9.1 9.3 9.3 9.4 8.9  MG 2.1  --   --  2.4  --   --   --   PHOS  --   --   --  4.1  --   --   --    < > = values in this interval not displayed.   Liver Function Tests: Recent Labs  Lab 12/17/23 0040  AST 30  ALT 30  ALKPHOS 64  BILITOT 0.6  PROT 7.6  ALBUMIN 4.0   No results for input(s): "LIPASE", "AMYLASE" in the last  168 hours. No results for input(s): "AMMONIA" in the last 168 hours. Cardiac Enzymes: No results for input(s): "CKTOTAL", "CKMB", "CKMBINDEX", "TROPONINI" in the last 168 hours. BNP (last 3 results) Recent Labs    12/17/23 0040  BNP 4.6   CBG: Recent Labs  Lab 12/21/23 0807 12/21/23 1150 12/21/23 1630 12/21/23 2026 12/22/23 0737  GLUCAP 138* 134* 136* 146* 115*    Time spent: 35 minutes  Signed:  Gillis Santa  [facility] 12/22/2023 10:43 AM

## 2023-12-22 NOTE — Progress Notes (Signed)
 [facility] Cardiology  CARDIOLOGY PROGRESS NOTE  Patient ID: Samuel Brady MRN: [id number] DOB/AGE: 1956-03-06 68 y.o.  Admit date: 12/17/2023 Referring Physician York Cerise Primary Physician  Primary Cardiologist  Reason for Consultation inferior STEMI  HPI: 68 year old [place] gentleman referred for inferior STEMI.  History obtained from conversation from EMS and patient's son over the phone.  Patient has had a recent history of recent syncopal episodes.  The patient awoke from sleep with substernal chest pain and EMS was called.  Patient was brought to [facility] ED where ECG revealed ST elevations in leads II, III and aVF, and V4, V5 and V6 consistent with inferolateral STEMI.  The patient was brought urgently to the cardiac catheterization laboratory where coronary arteriography revealed the vessel coronary artery disease, with 80% stenosis mid LAD, 80% stenosis in mid to distal left circumflex, 99% ulcerative stenosis mid RCA likely representing the culprit lesion.  The patient underwent primary PCI receiving 2.75 x 22 mm Onyx Frontier stent in the mid RCA.  Left ventriculography revealed near normal left ventricular function, with estimate LVEF 50% with mild inferoapical hypokinesis.  Interval history: -Patient seen and examined this morning. Resting comfortably. -Denies any chest pain or SOB. R wrist sore but without active bleeding or significant bruising. Dressing CDI.  -BP and heart rate remain stable. -Renal function remain stable as well.  Review of systems complete and found to be negative unless listed above     Past Medical History:  Diagnosis Date   Arthritis    Diabetes mellitus without complication (HCC)    Hyperlipidemia    Hypertension     Past Surgical History:  Procedure Laterality Date   CORONARY STENT INTERVENTION N/A 12/21/2023   Procedure: CORONARY STENT INTERVENTION;  Surgeon: Marcina Millard, MD;  Location: [facility] INVASIVE CV LAB;  Service: Cardiovascular;  Laterality:  N/A;   CORONARY/GRAFT ACUTE MI REVASCULARIZATION N/A 12/17/2023   Procedure: Coronary/Graft Acute MI Revascularization;  Surgeon: Marcina Millard, MD;  Location: [facility] INVASIVE CV LAB;  Service: Cardiovascular;  Laterality: N/A;   LEFT HEART CATH AND CORONARY ANGIOGRAPHY N/A 12/17/2023   Procedure: LEFT HEART CATH AND CORONARY ANGIOGRAPHY;  Surgeon: Marcina Millard, MD;  Location: [facility] INVASIVE CV LAB;  Service: Cardiovascular;  Laterality: N/A;    Medications Prior to Admission  Medication Sig Dispense Refill Last Dose/Taking   acetaminophen (TYLENOL) 500 MG tablet Take 500 mg by mouth every 6 (six) hours as needed.   Past Month   lisinopril (PRINIVIL,ZESTRIL) 20 MG tablet Take 20 mg by mouth daily.   Past Week   metformin (FORTAMET) 500 MG (OSM) 24 hr tablet Take 500 mg by mouth 2 (two) times daily with a meal.   Past Week   [DISCONTINUED] atorvastatin (LIPITOR) 40 MG tablet Take 40 mg by mouth at bedtime.   Past Week   loperamide (LOPERAMIDE A-D) 2 MG tablet Take 1 tablet (2 mg total) by mouth 4 (four) times daily as needed for diarrhea or loose stools. 30 tablet 0 Unknown   Social History   Socioeconomic History   Marital status: Married    Spouse name: Not on file   Number of children: Not on file   Years of education: Not on file   Highest education level: Not on file  Occupational History   Not on file  Tobacco Use   Smoking status: Former   Smokeless tobacco: Never   Tobacco comments:    Pt quit 20+yrs ago  Vaping Use   Vaping status: Never Used  Substance and Sexual  Activity   Alcohol use: No   Drug use: No   Sexual activity: Not on file  Other Topics Concern   Not on file  Social History Narrative   Not on file   Social Drivers of Health   Financial Resource Strain: Not on file  Food Insecurity: No Food Insecurity (12/17/2023)   Hunger Vital Sign    Worried About Running Out of Food in the Last Year: Never true    Ran Out of Food in the Last Year: Never  true  Transportation Needs: No Transportation Needs (12/17/2023)   PRAPARE - Administrator, Civil Service (Medical): No    Lack of Transportation (Non-Medical): No  Physical Activity: Not on file  Stress: Not on file  Social Connections: Socially Integrated (12/20/2023)   Social Connection and Isolation Panel [NHANES]    Frequency of Communication with Friends and Family: Three times a week    Frequency of Social Gatherings with Friends and Family: Three times a week    Attends Religious Services: More than 4 times per year    Active Member of Clubs or Organizations: Patient declined    Attends Banker Meetings: 1 to 4 times per year    Marital Status: Married  Catering manager Violence: Not At Risk (12/17/2023)   Humiliation, Afraid, Rape, and Kick questionnaire    Fear of Current or Ex-Partner: No    Emotionally Abused: No    Physically Abused: No    Sexually Abused: No    Family History  Problem Relation Age of Onset   Heart disease Mother        Patient was [age] year old; died while pregnant.  Patient told it may have been a heart attack.   Heart Problems Sister    Heart attack Brother 74   Brain cancer Sister    Diabetes Brother       Review of systems complete and found to be negative unless listed above    PHYSICAL EXAM  General: Well developed, well nourished, in no acute distress HEENT:  Normocephalic and atramatic Neck:  No JVD.  Lungs: Clear bilaterally to auscultation and percussion. Heart: HRRR . Normal S1 and S2 without gallops or murmurs.  Abdomen: Bowel sounds are positive, abdomen soft and non-tender  Msk:  Back normal, normal gait. Normal strength and tone for age. Extremities: No clubbing, cyanosis or edema.  R wrist access site without significant bruising, hematoma. Mild tenderness to palpation. Neuro: Alert and oriented X 3. Psych:  Good affect, responds appropriately  Labs:   Lab Results  Component Value Date   WBC 11.3  (H) 12/22/2023   HGB 12.7 (L) 12/22/2023   HCT 37.3 (L) 12/22/2023   MCV 74.7 (L) 12/22/2023   PLT 223 12/22/2023    Recent Labs  Lab 12/17/23 0040 12/17/23 0537 12/22/23 0420  NA 134*   < > 134*  K 3.9   < > 3.9  CL 104   < > 104  CO2 25   < > 21*  BUN 18   < > 20  CREATININE 1.09   < > 1.27*  CALCIUM 8.7*   < > 8.9  PROT 7.6  --   --   BILITOT 0.6  --   --   ALKPHOS 64  --   --   ALT 30  --   --   AST 30  --   --   GLUCOSE 168*   < > 107*   < > =  values in this interval not displayed.   Lab Results  Component Value Date   TROPONINI <0.03 11/02/2017    Lab Results  Component Value Date   CHOL 161 12/17/2023   Lab Results  Component Value Date   HDL 32 (L) 12/17/2023   Lab Results  Component Value Date   LDLCALC 80 12/17/2023   Lab Results  Component Value Date   TRIG 245 (H) 12/17/2023   Lab Results  Component Value Date   CHOLHDL 5.0 12/17/2023   No results found for: "LDLDIRECT"    Radiology: CARDIAC CATHETERIZATION Result Date: 12/21/2023   Dist LAD lesion is 75% stenosed.   Ost Cx to Prox Cx lesion is 50% stenosed.   Prox RCA lesion is 40% stenosed.   Dist RCA lesion is 50% stenosed.   Mid LAD lesion is 80% stenosed.   Mid Cx to Dist Cx lesion is 80% stenosed.   1st Mrg lesion is 70% stenosed.   Prox Cx to Mid Cx lesion is 75% stenosed.   Non-stenotic Mid RCA lesion was previously treated.   A drug-eluting stent was successfully placed using a STENT ONYX FRONTIER 2.25X15.   A drug-eluting stent was successfully placed using a STENT ONYX FRONTIER 2.0X12.   A drug-eluting stent was successfully placed using a STENT ONYX FRONTIER 2.25X08.   Post intervention, there is a 0% residual stenosis.   Post intervention, there is a 0% residual stenosis.   Post intervention, there is a 0% residual stenosis. 1.  Patent stent mid RCA 2.  Successful PCI with 2.25 x 15 mm Onyx frontier DES mid LAD 3.  Successful PCI with 2.25 x 8 mm Onyx frontier DES mid left circumflex 4.   Successful PCI with 2.0 x 12 mm Onyx frontier DES proximal/mid left circumflex Recommendations 1.  Dual antiplatelet therapy uninterrupted x 1 year 2.  Aggressive risk factor modification 3.  Consider discharge later today or in a.m.   ECHOCARDIOGRAM COMPLETE Result Date: 12/17/2023    ECHOCARDIOGRAM REPORT   Patient Name:   CERVANDO Mcgilvery   Date of Exam: 12/17/2023 Medical Rec #:  [id number]  Height:       66.0 in Accession #:    [id number] Weight:       161.6 lb Date of Birth:  1956/06/24  BSA:          1.827 m Patient Age:    67 years   BP:           131/74 mmHg Patient Gender: M          HR:           87 bpm. Exam Location:  [facility] Procedure: 2D Echo, Cardiac Doppler and Color Doppler (Both Spectral and Color            Flow Doppler were utilized during procedure). Indications:     Acute myocardial infarction - unspecified I21.9  History:         Patient has no prior history of Echocardiogram examinations.                  Risk Factors:Hypertension, Dyslipidemia and Diabetes.  Sonographer:     Cristela Blue Referring Phys:  [id number] ALEXANDER PARASCHOS Diagnosing Phys: Mellody Drown Alluri IMPRESSIONS  1. Left ventricular ejection fraction, by estimation, is 60 to 65%. The left ventricle has normal function. The left ventricle has no regional wall motion abnormalities. There is mild left ventricular hypertrophy. Left ventricular diastolic parameters are consistent with Grade I diastolic  dysfunction (impaired relaxation).  2. Right ventricular systolic function is normal. The right ventricular size is normal.  3. The mitral valve is normal in structure. Trivial mitral valve regurgitation.  4. The aortic valve is tricuspid. Aortic valve regurgitation is not visualized.  5. The inferior vena cava is normal in size with greater than 50% respiratory variability, suggesting right atrial pressure of 3 mmHg. FINDINGS  Left Ventricle: Left ventricular ejection fraction, by estimation, is 60 to 65%. The left ventricle has normal function.  The left ventricle has no regional wall motion abnormalities. The left ventricular internal cavity size was normal in size. There is  mild left ventricular hypertrophy. Left ventricular diastolic parameters are consistent with Grade I diastolic dysfunction (impaired relaxation). Right Ventricle: The right ventricular size is normal. No increase in right ventricular wall thickness. Right ventricular systolic function is normal. Left Atrium: Left atrial size was normal in size. Right Atrium: Right atrial size was normal in size. Pericardium: There is no evidence of pericardial effusion. Mitral Valve: The mitral valve is normal in structure. Mild mitral annular calcification. Trivial mitral valve regurgitation. Tricuspid Valve: The tricuspid valve is normal in structure. Tricuspid valve regurgitation is trivial. Aortic Valve: The aortic valve is tricuspid. Aortic valve regurgitation is not visualized. Aortic valve mean gradient measures 2.0 mmHg. Aortic valve peak gradient measures 3.7 mmHg. Aortic valve area, by VTI measures 2.79 cm. Pulmonic Valve: The pulmonic valve was not well visualized. Pulmonic valve regurgitation is not visualized. Aorta: The aortic root is normal in size and structure. Venous: The inferior vena cava is normal in size with greater than 50% respiratory variability, suggesting right atrial pressure of 3 mmHg. IAS/Shunts: The atrial septum is grossly normal.  LEFT VENTRICLE PLAX 2D LVIDd:         3.80 cm   Diastology LVIDs:         2.50 cm   LV e' medial:    4.90 cm/s LV PW:         1.30 cm   LV E/e' medial:  17.9 LV IVS:        1.20 cm   LV e' lateral:   10.60 cm/s LVOT diam:     2.00 cm   LV E/e' lateral: 8.3 LV SV:         50 LV SV Index:   28 LVOT Area:     3.14 cm  RIGHT VENTRICLE RV Basal diam:  3.00 cm RV Mid diam:    3.00 cm LEFT ATRIUM           Index        RIGHT ATRIUM           Index LA diam:      2.00 cm 1.09 cm/m   RA Area:     12.70 cm LA Vol (A2C): 28.0 ml 15.33 ml/m  RA  Volume:   27.30 ml  14.95 ml/m LA Vol (A4C): 11.4 ml 6.24 ml/m  AORTIC VALVE AV Area (Vmax):    2.41 cm AV Area (Vmean):   2.54 cm AV Area (VTI):     2.79 cm AV Vmax:           96.60 cm/s AV Vmean:          67.500 cm/s AV VTI:            0.180 m AV Peak Grad:      3.7 mmHg AV Mean Grad:      2.0 mmHg LVOT Vmax:  74.20 cm/s LVOT Vmean:        54.600 cm/s LVOT VTI:          0.160 m LVOT/AV VTI ratio: 0.89  AORTA Ao Root diam: 3.00 cm MITRAL VALVE                TRICUSPID VALVE MV Area (PHT): 3.87 cm     TR Peak grad:   10.8 mmHg MV Decel Time: 196 msec     TR Vmax:        164.00 cm/s MV E velocity: 87.50 cm/s MV A velocity: 130.00 cm/s  SHUNTS MV E/A ratio:  0.67         Systemic VTI:  0.16 m                             Systemic Diam: 2.00 cm Windell Norfolk Electronically signed by Windell Norfolk Signature Date/Time: 12/17/2023/3:28:56 PM    Final    CARDIAC CATHETERIZATION Result Date: 12/17/2023   Ost Cx to Prox Cx lesion is 50% stenosed.   Mid Cx to Dist Cx lesion is 80% stenosed.   Prox Cx to Mid Cx lesion is 60% stenosed.   Mid LAD lesion is 80% stenosed.   Dist LAD lesion is 75% stenosed.   Mid RCA lesion is 99% stenosed.   Prox RCA lesion is 40% stenosed.   Dist RCA lesion is 50% stenosed.   A drug-eluting stent was successfully placed using a STENT ONYX FRONTIER 2.75X22.   Post intervention, there is a 0% residual stenosis.   The left ventricular systolic function is normal.   The left ventricular ejection fraction is 50-55% by visual estimate. 1.  Inferior STEMI 2.  Three-vessel coronary artery disease with 80% stenosis mid LAD, 80% stenosis mid left circumflex, and 99% stenosis of mid RCA (culprit vessel) 3.  Near normal left ventricular function with estimate LVEF 50% with mild inferior apical hypokinesis 4.  Successful primary PCI with 2.75 x 22 mm Onyx Frontier DES mid RCA Recommendations 1.  Dual antiplatelet therapy uninterrupted x 1 year 2.  Start metoprolol succinate 25 mg daily 3.   Start atorvastatin 80 mg daily 4.  2D echocardiogram 5.  Staged PCI of mid LAD and mid left circumflex stenosis to be scheduled   EKG: Sinus rhythm with ST elevation in leads II, III and aVF, V4 V5 and V6  ASSESSMENT AND PLAN:   1.  Inferolateral STEMI, three-vessel coronary artery disease, with 80% stenosis LAD, 80% stenosis mid left circumflex, and 99% ulcerative stenosis mid RCA (culprit lesion), status post successful primary PCI with 2.75 x 22 mm Onyx frontier DES mid RCA 2.  Essential hypertension, on lisinopril 3.  Hyperlipidemia, on atorvastatin 4.  Type 2 diabetes, on metformin  Recommendations 1.  Dual antiplatelet therapy (aspirin and Brilinta) uninterrupted x 1 year 2.  Continue metoprolol tartrate 50 mg twice daily 3.  Continue lisinopril 20 mg daily 4.  Continue atorvastatin to 80 mg daily 5.  Echo with preserved EF, no wall motion abnormalities. 6.  S/p PCI of LAD and LCx this AM.  7.  Medications discussed in detail with patient and wife at bedside.  8. No recurrence of bleeding from R radial site overnight.  Ok for discharge today from a cardiac perspective. Will arrange for follow up in clinic with Dr. Corky Sing in 1-2 weeks.    This patient's plan of care was discussed and created with Dr. Darrold Junker  and he is in agreement.    SignedGale Journey, PA-C  12/22/2023, 9:02 AM

## 2023-12-22 NOTE — Progress Notes (Signed)
 Discharge paperwork and instructions provided to patient and patient's wife. PIV's removed and guaze in place. Removed from telemetry monitor. Patient dressed self. All belongings returned to patients satisfaction. Transported via wheelchair to medical mall entrance.

## 2024-02-17 ENCOUNTER — Other Ambulatory Visit: Payer: Self-pay | Admitting: Physician Assistant

## 2024-02-17 ENCOUNTER — Ambulatory Visit
Admission: RE | Admit: 2024-02-17 | Discharge: 2024-02-17 | Disposition: A | Discharge status: Home / Self Care | Source: Ambulatory Visit | Attending: Physician Assistant | Admitting: Physician Assistant

## 2024-02-17 DIAGNOSIS — M5441 Lumbago with sciatica, right side: Secondary | ICD-10-CM

## 2024-04-26 ENCOUNTER — Ambulatory Visit: Admitting: Student in an Organized Health Care Education/Training Program
